# Patient Record
Sex: Female | Born: 1990
Health system: Southern US, Community
[De-identification: ages and names within clinical notes are randomized; demographics above are authoritative.]

## PROBLEM LIST (undated history)

## (undated) DIAGNOSIS — F32A Depression, unspecified: Secondary | ICD-10-CM

## (undated) DIAGNOSIS — F419 Anxiety disorder, unspecified: Secondary | ICD-10-CM

## (undated) HISTORY — PX: TYMPANOSTOMY TUBE PLACEMENT: SHX32

---
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
## 2009-05-18 ENCOUNTER — Emergency Department: Payer: Self-pay | Admitting: Emergency Medicine

## 2011-04-05 ENCOUNTER — Emergency Department: Payer: Self-pay

## 2011-04-08 ENCOUNTER — Emergency Department: Payer: Self-pay | Admitting: Emergency Medicine

## 2011-11-27 ENCOUNTER — Emergency Department: Payer: Self-pay | Admitting: Emergency Medicine

## 2011-11-27 LAB — WET PREP, GENITAL

## 2011-12-08 ENCOUNTER — Emergency Department: Payer: Self-pay | Admitting: Emergency Medicine

## 2011-12-08 LAB — BASIC METABOLIC PANEL
Anion Gap: 8 (ref 7–16)
Calcium, Total: 8.8 mg/dL (ref 8.5–10.1)
Chloride: 106 mmol/L (ref 98–107)
Co2: 27 mmol/L (ref 21–32)
Glucose: 81 mg/dL (ref 65–99)
Osmolality: 279 (ref 275–301)
Sodium: 141 mmol/L (ref 136–145)

## 2011-12-08 LAB — CBC
HCT: 41.3 % (ref 35.0–47.0)
HGB: 14.1 g/dL (ref 12.0–16.0)
MCH: 30.3 pg (ref 26.0–34.0)
RBC: 4.67 10*6/uL (ref 3.80–5.20)
WBC: 7.9 10*3/uL (ref 3.6–11.0)

## 2011-12-25 ENCOUNTER — Emergency Department: Payer: Self-pay | Admitting: Emergency Medicine

## 2012-05-05 ENCOUNTER — Emergency Department: Payer: Self-pay | Admitting: Internal Medicine

## 2012-05-05 LAB — URINALYSIS, COMPLETE
Bilirubin,UR: NEGATIVE
Blood: NEGATIVE
Glucose,UR: NEGATIVE mg/dL (ref 0–75)
Ketone: NEGATIVE
Nitrite: NEGATIVE
Ph: 7 (ref 4.5–8.0)
RBC,UR: NONE SEEN /HPF (ref 0–5)
Specific Gravity: 1.011 (ref 1.003–1.030)
Squamous Epithelial: 30
WBC UR: 2 /HPF (ref 0–5)

## 2012-05-05 LAB — WET PREP, GENITAL

## 2012-07-17 ENCOUNTER — Observation Stay: Payer: Self-pay | Admitting: Internal Medicine

## 2012-09-14 ENCOUNTER — Emergency Department: Payer: Self-pay | Admitting: Emergency Medicine

## 2012-10-07 ENCOUNTER — Inpatient Hospital Stay: Payer: Self-pay | Admitting: Obstetrics & Gynecology

## 2012-10-07 LAB — CBC WITH DIFFERENTIAL/PLATELET
Basophil #: 0.1 10*3/uL (ref 0.0–0.1)
Eosinophil #: 0.1 10*3/uL (ref 0.0–0.7)
Lymphocyte #: 2.4 10*3/uL (ref 1.0–3.6)
Lymphocyte %: 22.8 %
MCH: 31.3 pg (ref 26.0–34.0)
Monocyte #: 1.1 x10 3/mm — ABNORMAL HIGH (ref 0.2–0.9)
Platelet: 119 10*3/uL — ABNORMAL LOW (ref 150–440)
RBC: 4.12 10*6/uL (ref 3.80–5.20)
RDW: 13.4 % (ref 11.5–14.5)

## 2013-06-15 IMAGING — US US EXTREM UP VENOUS*R*
1 series · 17 of 24 positions shown · non-contrast
Comparison: none

REASON FOR EXAM: discomfort, mild swelling, to right arm, eval for DVT
COMMENTS:

[Series 1: us extrem up venous*right* · 17 of 52 slices shown]
[im 1/52]
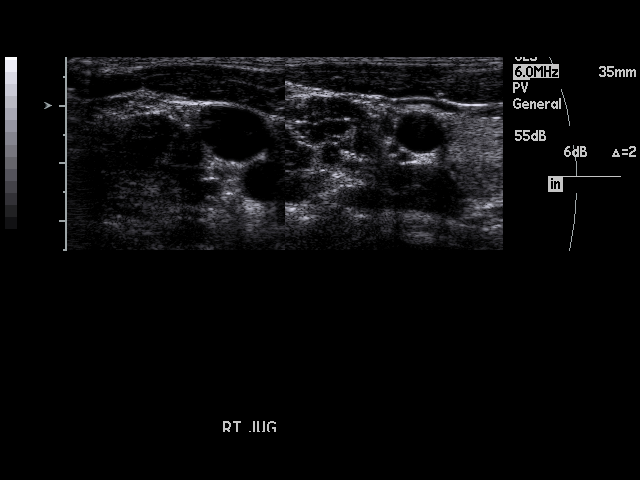
[im 5/52]
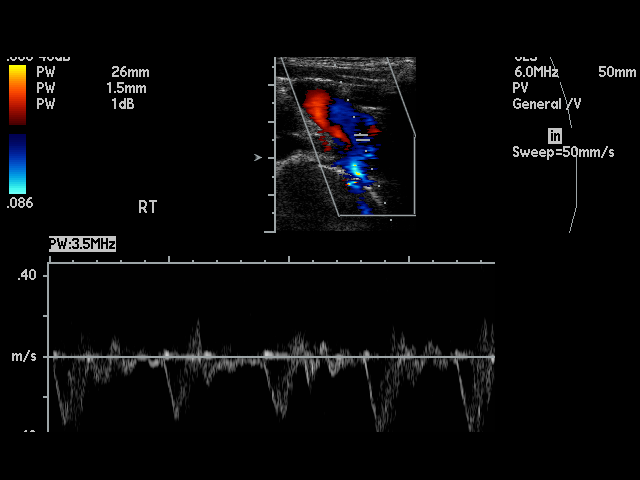
[im 7/52]
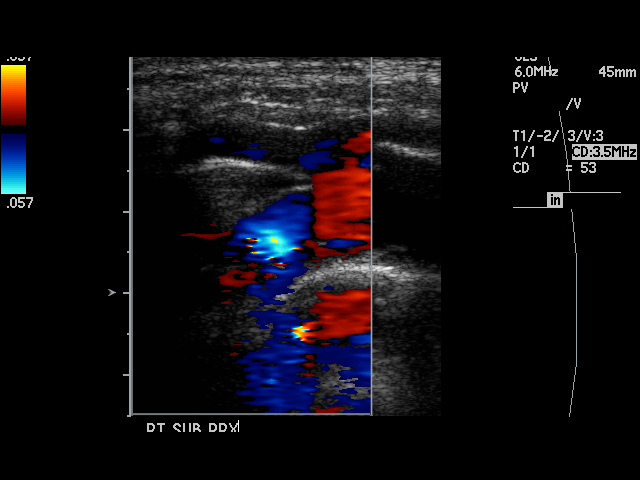
[im 9/52]
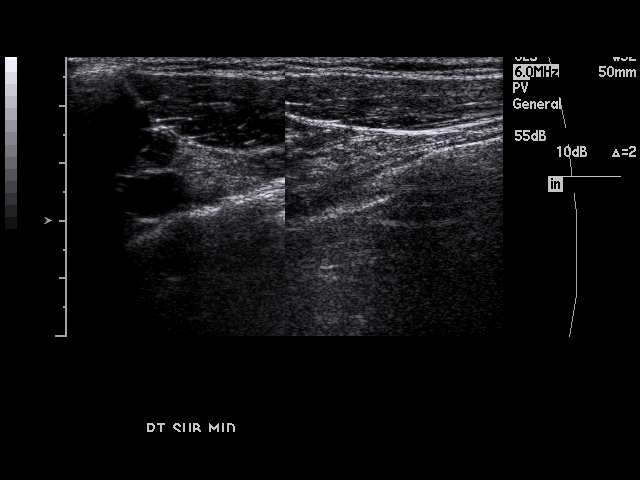
[im 14/52]
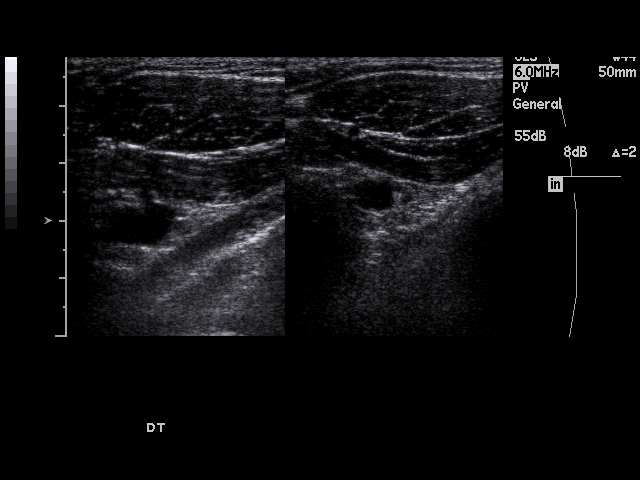
[im 16/52]
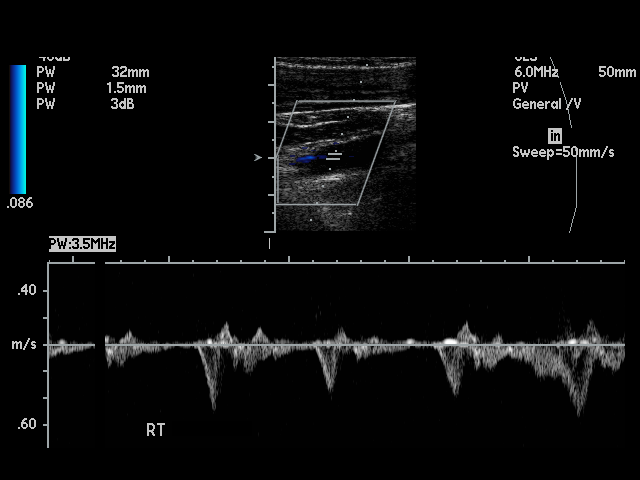
[im 20/52]
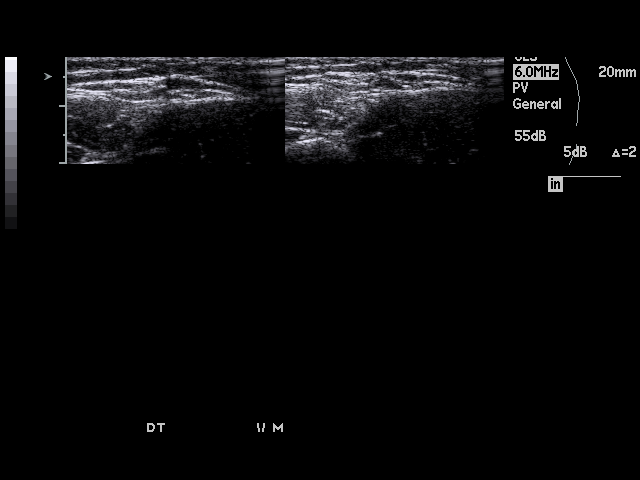
[im 23/52]
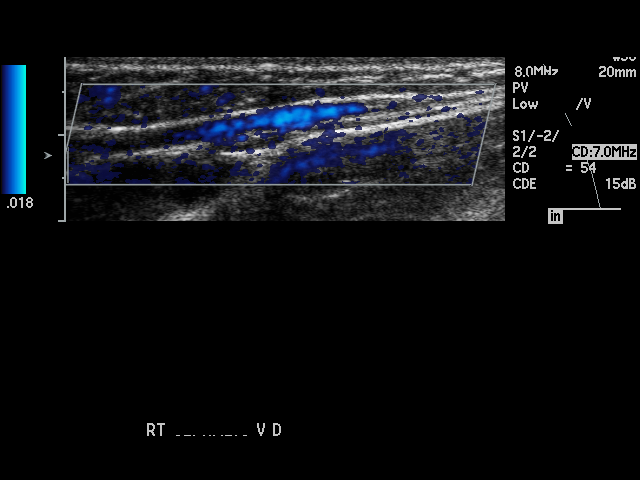
[im 27/52]
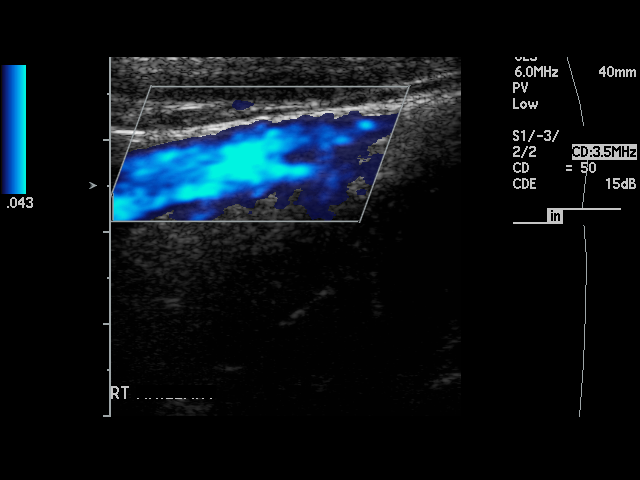
[im 29/52]
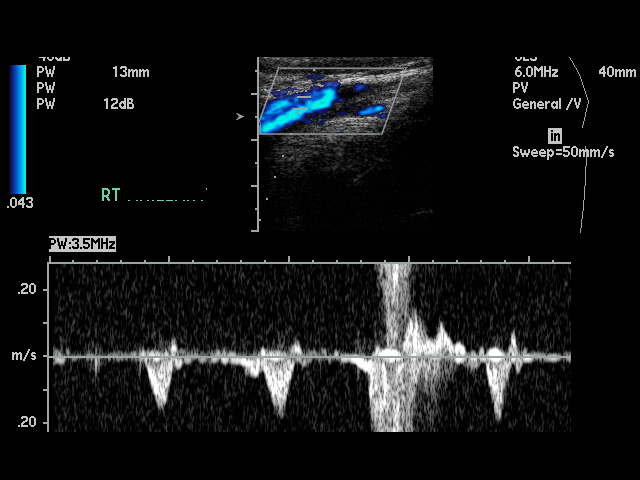
[im 32/52]
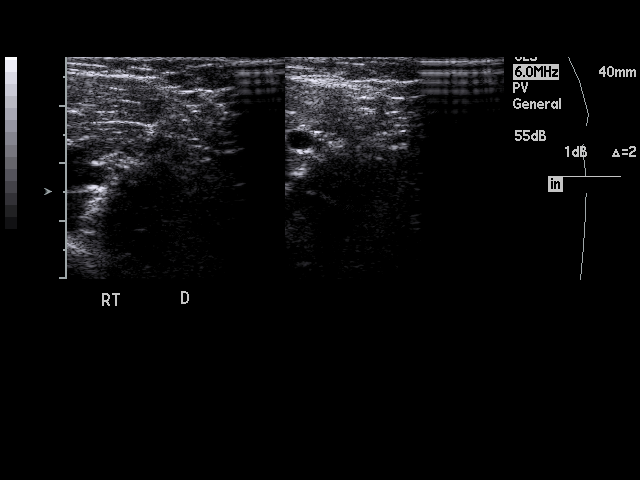
[im 36/52]
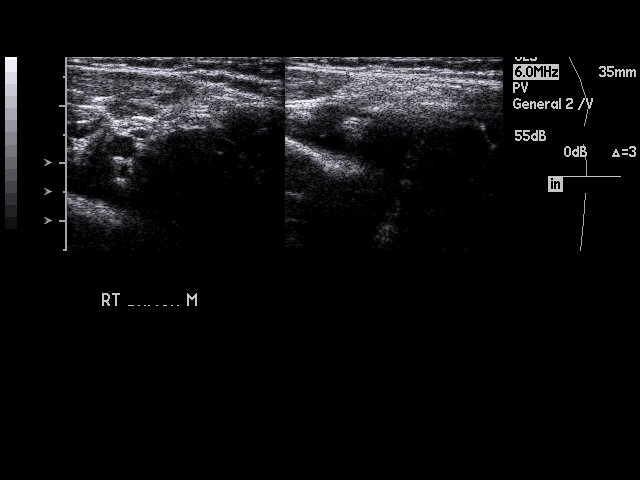
[im 38/52]
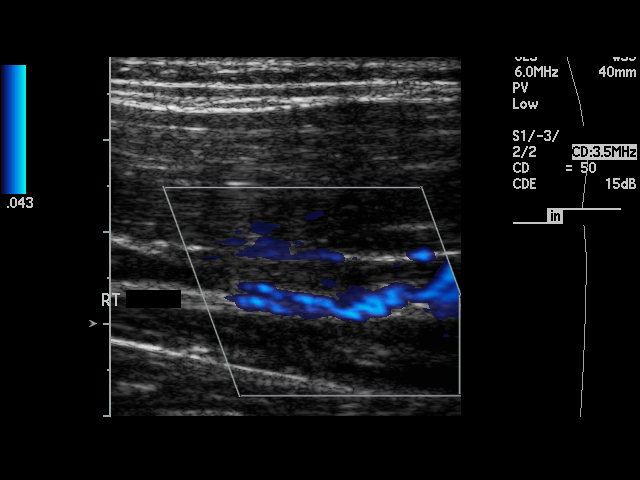
[im 43/52]
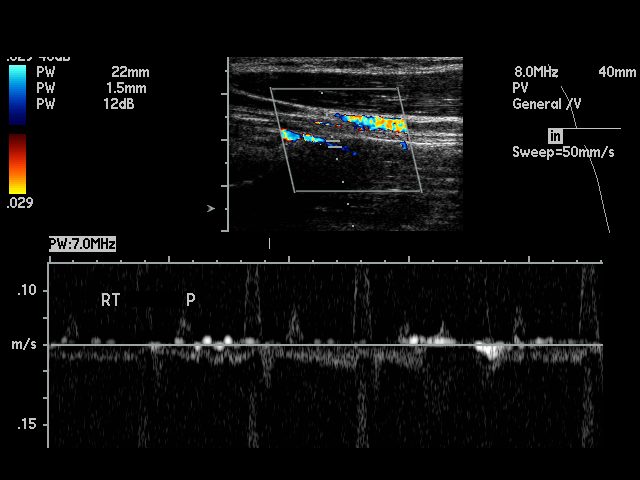
[im 45/52]
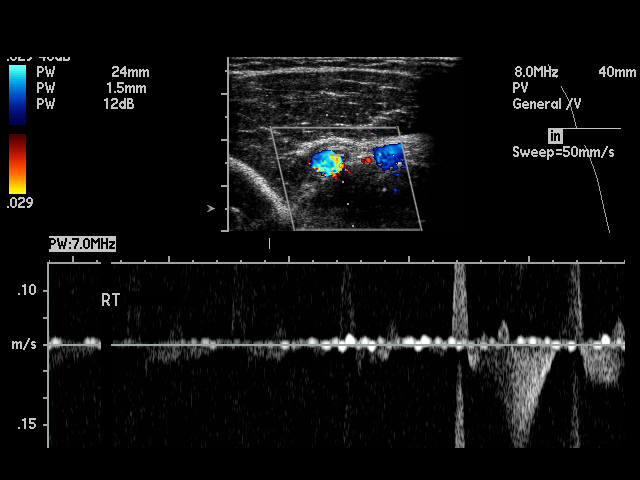
[im 47/52]
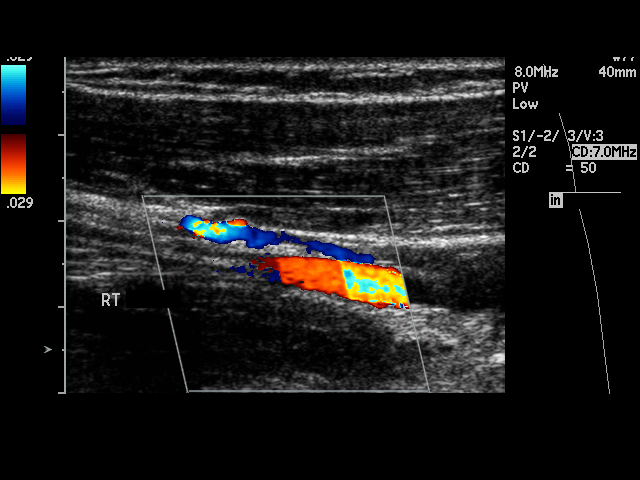
[im 52/52]
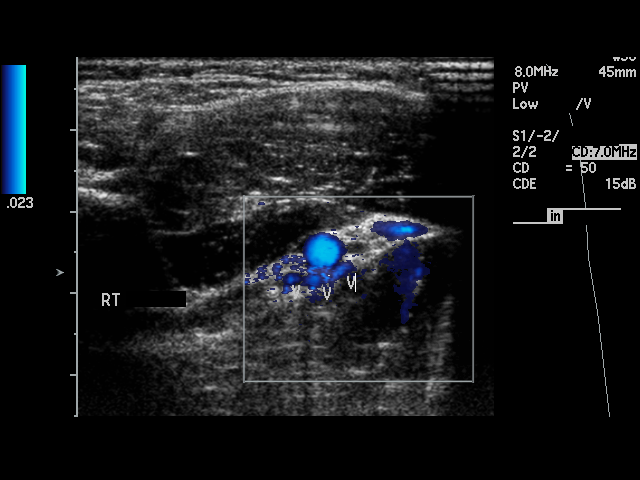

[17 of 24 positions shown; findings below may reference images not displayed]

PROCEDURE:     US  - US DOPPLER UP EXTR RIGHT  - December 08, 2011 [DATE]

RESULT:     The venous system of the left upper arm is interrogated from the
level of the jugular vein inferiorly to the elbow. The subclavian, axillary
and brachial veins are patent with no thrombus or occlusion identified. The
superficial venous system of the right upper arm including the cephalic and
basilic vein is also noted to be patent with no thrombus or occlusion
identified. The antecubital vein is not seen due to the presence of a
bandage.
IMPRESSION: 1. No venous thrombosis is identified in the right upper arm.
2. Note is made that the antecubital vein is obscured by a bandage.

## 2014-02-23 ENCOUNTER — Ambulatory Visit: Payer: Self-pay | Admitting: Physician Assistant

## 2015-01-15 NOTE — Op Note (Signed)
PATIENT NAMEHARA, [name] MR#:  [id number] DATE OF BIRTH:  [birth year]  DATE OF PROCEDURE:  10/07/2012  PREOPERATIVE DIAGNOSES: 1. Intrauterine pregnancy at 37 weeks, 3 days.  2. Breech presentation.  3. Labor. 4. Uterine didelphys.   POSTOPERATIVE DIAGNOSES:  1. Intrauterine pregnancy at 37 weeks, 3 days.  2. Breech presentation.  3. Labor. 4. Uterine didelphys.   PROCEDURE PERFORMED: Primary low transverse cesarean section.   SURGEON: Verlin Grills, M.D.   ASSISTANT: Marta Antu, CNM   ANESTHESIA: Spinal.   INDICATION: A 24 year old G1 who is 37 weeks, 3 days who presented with ruptured membranes and contractions with baby in complete breech presentation.   COMPLICATIONS: None.   ESTIMATED BLOOD LOSS: 600 mL.   IV FLUIDS: 1500 mL crystalloid.   URINE OUTPUT: 150 mL, clear.   FINDINGS: Normal tubes and ovaries bilaterally. Uterine didelphys as previously noted. Baby in complete breech presentation with only terminal meconium. Apgars 8 and 9. Weight 2780 grams. Pediatricians were present for delivery.   MEDICATIONS: One gram Ancef preoperatively and 20 units dilute Pitocin following placental delivery.   DISPOSITION: Mother to recovery room and infant to newborn nursery, both in stable condition.   PROCEDURE IN DETAIL: The patient was taken to the operating room where she was given anesthesia via spinal. She was laid supine on the operating room table and prepped and draped in standard sterile fashion. Prior to incision level of the anesthesia was checked and found to be adequate. 2 cm above the pubic symphysis, a Pfannenstiel incision was made in the skin using the knife and carried down sharply to the level of the rectus fascia. The rectus fascia was then incised in the midline. The fascial opening was extended bilaterally superiorly with Mayo scissors. The superior border of the rectus fascia was then grasped, elevated and bluntly dissected off the underlying  rectus muscles. The median raphe was taken down with Mayo scissors and blunt dissection. Attention was then turned to the inferior border of the rectus fascia and it was taken down in a similar manner, to the level of the pubic symphysis. The rectus muscles were then separated in the midline. The peritoneum was identified, elevated using hemostats and incised with Metzenbaum scissors. Peritoneal opening was extended manually by pulling. The bladder blade was then inserted and the bladder was noted to be in a low position away from site of desired hysterotomy. The operator's hand was inserted into the uterus to find the uterus to be in nonrotated position with baby in the left aspect of uterus. Hysterotomy incision was then made using the knife, low transverse. This was carried down and the uterine cavity was entered bluntly with the operator's finger. Hysterotomy was extended bilaterally and superiorly using the operator's fingers. The operator's hand was inserted into the uterus to find the infant in the breech presentation. The infant's feet were grasped and brought to the level of the hysterotomy and then with gentle traction the baby was delivered up to the level of the shoulder blades at which time the baby was rotated 45 degrees. The right arm was delivered, rotated 180 degrees, the left arm was delivered, and then the baby's head was delivered using Mauriceau-Smellie-Viet maneuver and fundal pressure. Cord was doubly clamped and cut. The infant was passed to waiting pediatricians. Placenta was delivered using fundal pressure. The uterus was exteriorized. The bladder blade was reinserted. The uterine cavity was wiped free of all clots and debris. Both left and right uterine cavities were  inspected as the right uterus was also inadvertently entered. The hysterotomy was repaired first with 0 Vicryl suture in a running locked fashion followed by a second 0 Vicryl stitch in a horizontal imbricating fashion.  Hysterotomy was inspected and found to be hemostatic. The uterus was then replaced into the abdomen. The abdominal gutters were suctioned free of clots and debris. The On-Q catheters were placed infraumbilically just below the level of the rectus fascia. Hysterotomy was again reinspected and found to be hemostatic. The rectus fascia was then closed using a #1 Vicryl stitch in a running fashion. The incision was then copiously irrigated. Hemostasis was achieved using the Bovie. Skin was closed using Insorb absorbable staples and bandaged with Steri-Strips. Dermabond was placed over On-Q catheter sites. All counts were correct x 2. The patient tolerated the procedure well and was taken to the recovery room in stable condition.  ____________________________ Ali LoweEryn K. Garnette GunnerStansbury Clipp, MD eks:sb D: 10/07/2012 03:17:46 ET T: 10/07/2012 10:05:31 ET JOB#: [id number]  cc: Weston SettleEryn K. Garnette GunnerStansbury Clipp, MD, <Dictator> Marlaine HindERYN K STANSBURY CLIPP MD ELECTRONICALLY SIGNED 10/08/2012 14:02
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
## 2015-02-02 NOTE — H&P (Signed)
L&D Evaluation:  History Expanded:   HPI 24 yo G1 with EDD of 10/25/12,  presents at 37 3/7 weeks with SROM at 0015 today. [facility] uterus with 2 cervices and breech presentation, plan for LTCS at 39 weeks. RH negative, rhogam received. H/o HSV - tx with acyclovir ppx.    Blood Type (Maternal) O negative    Group B Strep Results Maternal (Result >5wks must be treated as unknown) negative    Maternal HIV Negative    Maternal Syphilis Ab Nonreactive    Maternal Varicella Immune    Rubella Results (Maternal) immune    Maternal T-Dap Unknown    Patient's Medical History No Chronic Illness    Patient's Surgical History none    Medications Pre Natal Vitamins  zantac    Allergies NKDA    Social History tobacco   ROS:   ROS see HPI   Exam:   Vital Signs stable    General no apparent distress    Mental Status clear    Chest clear    Heart no murmur/gallop/rubs    Abdomen gravid, tender with contractions    Estimated Fetal Weight Average for gestational age    Fetal Position breech per bedside US    Edema no edema    Reflexes 2+    Pelvic both cervices 1 cm    Mebranes Ruptured    Description clear    Ucx regular, q 5 min   Impression:   Impression SROM, labor   Plan:   Comments Dr Elta Guadeloupelipp notified, will plan for urgent C-section.  Terbutaline SQ, IV fluid bolus.   Electronic Signatures: Vella KohlerBrothers, Shanya Ferriss K (CNM)  (Signed 13-Jan-14 01:47)  Authored: L&D Evaluation   Last Updated: 13-Jan-14 01:47 by Vella KohlerBrothers, Casilda Pickerill K (CNM)

## 2015-12-02 ENCOUNTER — Ambulatory Visit
Admission: EM | Admit: 2015-12-02 | Discharge: 2015-12-02 | Discharge status: Absent without leave | Payer: Managed Care, Other (non HMO)

## 2015-12-04 ENCOUNTER — Ambulatory Visit
Admission: EM | Admit: 2015-12-04 | Discharge: 2015-12-04 | Disposition: A | Discharge status: Home / Self Care | Payer: Managed Care, Other (non HMO) | Attending: Family Medicine | Admitting: Family Medicine

## 2015-12-04 ENCOUNTER — Encounter: Payer: Self-pay | Admitting: Gynecology

## 2015-12-04 DIAGNOSIS — J069 Acute upper respiratory infection, unspecified: Secondary | ICD-10-CM | POA: Diagnosis not present

## 2015-12-04 DIAGNOSIS — H66012 Acute suppurative otitis media with spontaneous rupture of ear drum, left ear: Secondary | ICD-10-CM | POA: Diagnosis not present

## 2015-12-04 MED ORDER — FEXOFENADINE-PSEUDOEPHED ER 180-240 MG PO TB24: 1.0000 | ORAL_TABLET | Freq: Every day | ORAL | Status: DC

## 2015-12-04 MED ORDER — AMOXICILLIN-POT CLAVULANATE 875-125 MG PO TABS: 1.0000 | ORAL_TABLET | Freq: Two times a day (BID) | ORAL | Status: DC

## 2015-12-04 NOTE — Discharge Instructions (Signed)
Otitis Media, Adult °Otitis media is redness, soreness, and puffiness (swelling) in the space just behind your eardrum (middle ear). It may be caused by allergies or infection. It often happens along with a cold. °HOME CARE °· Take your medicine as told. Finish it even if you start to feel better. °· Only take over-the-counter or prescription medicines for pain, discomfort, or fever as told by your doctor. °· Follow up with your doctor as told. °GET HELP IF: °· You have otitis media only in one ear, or bleeding from your nose, or both. °· You notice a lump on your neck. °· You are not getting better in 3-5 days. °· You feel worse instead of better. °GET HELP RIGHT AWAY IF:  °· You have pain that is not helped with medicine. °· You have puffiness, redness, or pain around your ear. °· You get a stiff neck. °· You cannot move part of your face (paralysis). °· You notice that the bone behind your ear hurts when you touch it. °MAKE SURE YOU:  °· Understand these instructions. °· Will watch your condition. °· Will get help right away if you are not doing well or get worse. °  °This information is not intended to replace advice given to you by your health care provider. Make sure you discuss any questions you have with your health care provider. °  °Document Released: 02/28/2008 Document Revised: 10/02/2014 Document Reviewed: 04/08/2013 °Elsevier Interactive Patient Education ©2016 [employer] ° ° °

## 2015-12-04 NOTE — ED Provider Notes (Signed)
CSN: [id number]     Arrival date & time 12/04/15  4098 History   First MD Initiated Contact with Patient 12/04/15 1025   Nurses notes were reviewed. Chief Complaint  Patient presents with  . Otalgia   Mother is not best historian but child's crying in the room as well. Apparently she and the child both been sick now for about 2 weeks off and on. One would seem to be recovering from the URI and the other one would get sick. She states about 2-3 days ago her left ear started bothering her. Got worse last night. She has a history of recurrent ear infections as a child and has had ear tubes and other ear problems as well previously. She does report some drainage coming from the left ear. She does not report problems with the right ear at this time. She states that she's doesn't smoke and other than her daughter being sick no pertinent family medical history is is available. She denies any fever.   (Consider location/radiation/quality/duration/timing/severity/associated sxs/prior Treatment) Patient is a 25 y.o. female presenting with ear pain. The history is provided by the patient. No language interpreter was used.  Otalgia Location:  Left Quality:  Pressure Severity:  Moderate Progression:  Worsening Chronicity:  New Associated symptoms: congestion, cough, ear discharge and rhinorrhea   Associated symptoms: no rash   Risk factors: chronic ear infection and prior ear surgery     History reviewed. No pertinent past medical history. Past Surgical History  Procedure Laterality Date  . Tympanostomy tube placement     No family history on file. Social History  Substance Use Topics  . Smoking status: Never Smoker   . Smokeless tobacco: None  . Alcohol Use: Yes   OB History    No data available     Review of Systems  HENT: Positive for congestion, ear discharge, ear pain and rhinorrhea.   Respiratory: Positive for cough.   Skin: Negative for rash.  All other systems reviewed and are  negative.   Allergies  Review of patient's allergies indicates no known allergies.  Home Medications   Prior to Admission medications   Medication Sig Start Date End Date Taking? Authorizing Provider  amoxicillin-clavulanate (AUGMENTIN) 875-125 MG tablet Take 1 tablet by mouth 2 (two) times daily. 12/04/15   Hassan Rowan, MD  fexofenadine-pseudoephedrine (ALLEGRA-D ALLERGY & CONGESTION) 180-240 MG 24 hr tablet Take 1 tablet by mouth daily. 12/04/15   Hassan Rowan, MD   Meds Ordered and Administered this Visit  Medications - No data to display  BP 119/87 mmHg  Pulse 100  Temp(Src) 98.1 F (36.7 C) (Oral)  Ht 5' (1.524 m)  Wt 163 lb (73.936 kg)  BMI 31.83 kg/m2  SpO2 98%  LMP 11/13/2015 No data found.   Physical Exam  Constitutional: She is oriented to person, place, and time. She appears well-developed.  HENT:  Head: Normocephalic and atraumatic.  Right Ear: Tympanic membrane is erythematous and bulging.  Left Ear: There is drainage.  Nose: Mucosal edema and rhinorrhea present.  Mouth/Throat: Normal dentition. No dental caries. No posterior oropharyngeal erythema.  Eyes: Conjunctivae are normal. Pupils are equal, round, and reactive to light.  Neck: Normal range of motion. Neck supple.  Pulmonary/Chest: Breath sounds normal.  Musculoskeletal: Normal range of motion.  Neurological: She is alert and oriented to person, place, and time.  Skin: Skin is warm and dry.  Psychiatric: She has a normal mood and affect.  Vitals reviewed.   ED Course  Procedures (including critical care time)  Labs Review Labs Reviewed - No data to display  Imaging Review No results found.   Visual Acuity Review  Right Eye Distance:   Left Eye Distance:   Bilateral Distance:    Right Eye Near:   Left Eye Near:    Bilateral Near:         MDM   1. Acute suppurative otitis media of left ear with spontaneous rupture of tympanic membrane, recurrence not specified   2. URI, acute     We'll place mom on Augmentin 875 one tablet twice a day Allegra-D 24 hours 1 tablet daily follow-up with PCP in 2 weeks to make sure that the eardrum has healed and will require ENT referral.  Note: This dictation was prepared with Dragon dictation along with smaller phrase technology. Any transcriptional errors that result from this process are unintentional.    Hassan RowanEugene Danyetta Gillham, MD 12/04/15 1105

## 2015-12-04 NOTE — ED Notes (Signed)
Patient stated left ear ache x 4 days.

## 2020-08-16 ENCOUNTER — Ambulatory Visit
Admission: EM | Admit: 2020-08-16 | Discharge: 2020-08-16 | Disposition: A | Discharge status: Home / Self Care | Payer: 59

## 2020-08-16 ENCOUNTER — Other Ambulatory Visit: Payer: Self-pay

## 2020-08-16 NOTE — ED Triage Notes (Signed)
Patient states she had an unwitnessed fall this morning and states she had LOC for about 10 min. She is c/o facial pain and neck pain. Her mother drove her here to urgent care. Patient asked if this UC had CT capability and this RN advised that we do not have CT today.

## 2021-08-06 ENCOUNTER — Ambulatory Visit
Admission: EM | Admit: 2021-08-06 | Discharge: 2021-08-06 | Disposition: A | Discharge status: Home / Self Care | Payer: BLUE CROSS/BLUE SHIELD

## 2021-08-06 ENCOUNTER — Other Ambulatory Visit: Payer: Self-pay

## 2021-08-06 ENCOUNTER — Ambulatory Visit (INDEPENDENT_AMBULATORY_CARE_PROVIDER_SITE_OTHER): Payer: BLUE CROSS/BLUE SHIELD

## 2021-08-06 DIAGNOSIS — R0602 Shortness of breath: Secondary | ICD-10-CM

## 2021-08-06 DIAGNOSIS — R059 Cough, unspecified: Secondary | ICD-10-CM | POA: Diagnosis not present

## 2021-08-06 DIAGNOSIS — R0981 Nasal congestion: Secondary | ICD-10-CM | POA: Diagnosis not present

## 2021-08-06 DIAGNOSIS — J189 Pneumonia, unspecified organism: Secondary | ICD-10-CM | POA: Diagnosis not present

## 2021-08-06 DIAGNOSIS — R051 Acute cough: Secondary | ICD-10-CM | POA: Diagnosis not present

## 2021-08-06 HISTORY — DX: Anxiety disorder, unspecified: F41.9

## 2021-08-06 HISTORY — DX: Depression, unspecified: F32.A

## 2021-08-06 MED ORDER — AMOXICILLIN-POT CLAVULANATE 875-125 MG PO TABS: 1.0000 | ORAL_TABLET | Freq: Two times a day (BID) | ORAL | 0 refills | Status: AC

## 2021-08-06 MED ORDER — AZITHROMYCIN 250 MG PO TABS: 250.0000 mg | ORAL_TABLET | Freq: Every day | ORAL | 0 refills | Status: DC

## 2021-08-06 MED ORDER — ALBUTEROL SULFATE HFA 108 (90 BASE) MCG/ACT IN AERS: 1.0000 | INHALATION_SPRAY | Freq: Four times a day (QID) | RESPIRATORY_TRACT | 0 refills | Status: DC | PRN

## 2021-08-06 MED ORDER — ALBUTEROL SULFATE HFA 108 (90 BASE) MCG/ACT IN AERS
1.0000 | INHALATION_SPRAY | Freq: Four times a day (QID) | RESPIRATORY_TRACT | 0 refills | Status: AC | PRN
Start: 2021-08-06 — End: ?

## 2021-08-06 MED ORDER — AMOXICILLIN-POT CLAVULANATE 875-125 MG PO TABS: 1.0000 | ORAL_TABLET | Freq: Two times a day (BID) | ORAL | 0 refills | Status: DC

## 2021-08-06 NOTE — Discharge Instructions (Signed)
-   It does look like you have pneumonia of the left lower lobe.  I have sent 2 different antibiotics to cover you for infection. - Can take Robitussin OTC, but it can have affects on milk supply.  Be advised that cough can linger for a little while even after you sure you feel better. - If you have any fever or increased shortness of breath you should be seen again.  Make sure to increase rest and fluids. - Follow-up with your PCP in the next week or 2 for reexamination.  In about a month you will need a repeat x-ray to make sure the infection is cleared. - Go to emergency department for any severe acute worsening of her symptoms.

## 2021-08-06 NOTE — ED Triage Notes (Signed)
Pt states she had flu 2 weeks ago. Cough started about a week ago and "settled in my chest". Coughing up whitish phlegm and hurts to take a deep breath.

## 2021-08-06 NOTE — ED Provider Notes (Signed)
[facility]    CSN: [id number] Arrival date & time: 08/06/21  0813      History   Chief Complaint Chief Complaint  Patient presents with   Cough    HPI [name] is a 30 y.o. female presenting for 2-week history of cough.  Patient diagnosed with influenza about 2 weeks ago.  States she feels like the infection has settled in her chest.  Cough is productive of whitish phlegm.  Admits to pain on taking a deep breath.  Patient says she felt better after few days of the fluid but then over the past week her symptoms have gotten a lot worse.  She says the cough is worse and deeper.  She also reports occasional chest tightness and breathing difficulty.  Patient says she has felt feverish but has not checked her temperature.  Current temperature is 99.1 degrees.  Patient has not been taking any cough medication over-the-counter.  Denies any sick contacts or known exposure to COVID-19.  Medical history significant for anxiety and depression but does not report any history of cardiopulmonary disease.  Patient is currently breast-feeding.  No other complaints.  HPI  Past Medical History:  Diagnosis Date   Anxiety    Depression     There are no problems to display for this patient.   Past Surgical History:  Procedure Laterality Date   TYMPANOSTOMY TUBE PLACEMENT      OB History   No obstetric history on file.      Home Medications    Prior to Admission medications   Medication Sig Start Date End Date Taking? Authorizing Provider  busPIRone (BUSPAR) 5 MG tablet Take by mouth. 08/18/20 08/18/21 Yes [provider]  citalopram (CELEXA) 20 MG tablet Take by mouth. 07/18/16  Yes [provider]  albuterol (VENTOLIN HFA) 108 (90 Base) MCG/ACT inhaler Inhale 1-2 puffs into the lungs every 6 (six) hours as needed for wheezing or shortness of breath. 08/06/21   Eusebio Friendly B, PA-C  amoxicillin-clavulanate (AUGMENTIN) 875-125 MG tablet Take 1 tablet by  mouth every 12 (twelve) hours for 7 days. 08/06/21 08/13/21  Eusebio Friendly B, PA-C  azithromycin (ZITHROMAX) 250 MG tablet Take 1 tablet (250 mg total) by mouth daily. Take first 2 tablets together, then 1 every day until finished. 08/06/21   Shirlee Latch, PA-C    Family History History reviewed. No pertinent family history.  Social History Social History   Tobacco Use   Smoking status: Never   Smokeless tobacco: Never  Vaping Use   Vaping Use: Never used  Substance Use Topics   Alcohol use: Yes   Drug use: No     Allergies   Patient has no known allergies.   Review of Systems Review of Systems  Constitutional:  Positive for fatigue. Negative for chills, diaphoresis and fever.  HENT:  Positive for congestion and rhinorrhea. Negative for ear pain, sinus pressure, sinus pain and sore throat.   Respiratory:  Positive for cough, chest tightness and shortness of breath. Negative for wheezing.   Cardiovascular:  Positive for chest pain (rib pain on breathing).  Gastrointestinal:  Negative for abdominal pain, nausea and vomiting.  Musculoskeletal:  Negative for arthralgias and myalgias.  Skin:  Negative for rash.  Neurological:  Negative for weakness and headaches.  Hematological:  Negative for adenopathy.    Physical Exam Triage Vital Signs ED Triage Vitals  Enc Vitals Group     BP      Pulse  Resp      Temp      Temp src      SpO2      Weight      Height      Head Circumference      Peak Flow      Pain Score      Pain Loc      Pain Edu?      Excl. in GC?    No data found.  Updated Vital Signs BP 96/63 (BP Location: Right Arm)   Pulse (!) 105   Temp 99.1 F (37.3 C) (Oral)   Resp 20   Ht 5\' 1"  (1.549 m)   Wt 195 lb (88.5 kg)   LMP 11/11/2020   SpO2 96%   BMI 36.84 kg/m      Physical Exam Vitals and nursing note reviewed.  Constitutional:      General: She is not in acute distress.    Appearance: Normal appearance. She is ill-appearing. She  is not toxic-appearing.  HENT:     Head: Normocephalic and atraumatic.     Nose: Congestion present.     Mouth/Throat:     Mouth: Mucous membranes are moist.     Pharynx: Oropharynx is clear. No posterior oropharyngeal erythema.  Eyes:     General: No scleral icterus.       Right eye: No discharge.        Left eye: No discharge.     Conjunctiva/sclera: Conjunctivae normal.  Cardiovascular:     Rate and Rhythm: Normal rate and regular rhythm.     Heart sounds: Normal heart sounds.  Pulmonary:     Effort: Pulmonary effort is normal. No respiratory distress.     Breath sounds: Rhonchi (LLL) and rales (LLL) present.  Musculoskeletal:     Cervical back: Neck supple.  Skin:    General: Skin is dry.  Neurological:     General: No focal deficit present.     Mental Status: She is alert. Mental status is at baseline.     Motor: No weakness.     Gait: Gait normal.  Psychiatric:        Mood and Affect: Mood normal.        Behavior: Behavior normal.        Thought Content: Thought content normal.     UC Treatments / Results  Labs (all labs ordered are listed, but only abnormal results are displayed) Labs Reviewed - No data to display  EKG   Radiology DG Chest 2 View  Result Date: 08/06/2021 CLINICAL DATA:  Cough for 2 weeks.  Recent flu EXAM: CHEST - 2 VIEW COMPARISON:  None. FINDINGS: Subtle reticulonodular opacity at the left base. No edema, effusion, or pneumothorax. Normal heart size. IMPRESSION: Early pneumonia on the left. Electronically Signed   By: 13/08/2021 M.D.   On: 08/06/2021 09:26    Procedures Procedures (including critical care time)  Medications Ordered in UC Medications - No data to display  Initial Impression / Assessment and Plan / UC Course  I have reviewed the triage vital signs and the nursing notes.  Pertinent labs & imaging results that were available during my care of the patient were reviewed by me and considered in my medical decision  making (see chart for details).  30 year old female presenting for 2-week history of cough and congestion.  Worsening symptoms over the past 1 week with associated chest tightness and shortness of breath.  Vital stable today.  Patient  is ill-appearing but nontoxic.  Exam does reveal nasal congestion.  She also has rhonchi and rales of the left lower lobe.  Chest x-ray ordered out of concern for community-acquired pneumonia.  Chest x-ray independently viewed by me.  X-ray result does show early pneumonia of the left lower lobe.  Discussed this with patient.  Will treat for community-acquired pneumonia with Augmentin and azithromycin.  Also sent in ProAir.  Advised increasing rest and fluids.  Advised she can take over-the-counter Robitussin but this could have effects on her milk supply.  Reviewed return and ED precautions as well as when to follow-up with PCP.   Final Clinical Impressions(s) / UC Diagnoses   Final diagnoses:  Community acquired pneumonia of left lower lobe of lung  Acute cough  Nasal congestion  Shortness of breath     Discharge Instructions      - It does look like you have pneumonia of the left lower lobe.  I have sent 2 different antibiotics to cover you for infection. - Can take Robitussin OTC, but it can have affects on milk supply.  Be advised that cough can linger for a little while even after you sure you feel better. - If you have any fever or increased shortness of breath you should be seen again.  Make sure to increase rest and fluids. - Follow-up with your PCP in the next week or 2 for reexamination.  In about a month you will need a repeat x-ray to make sure the infection is cleared. - Go to emergency department for any severe acute worsening of her symptoms.     ED Prescriptions     Medication Sig Dispense Auth. Provider   amoxicillin-clavulanate (AUGMENTIN) 875-125 MG tablet  (Status: Discontinued) Take 1 tablet by mouth every 12 (twelve) hours for 7  days. 14 tablet Eusebio Friendly B, PA-C   albuterol (VENTOLIN HFA) 108 (90 Base) MCG/ACT inhaler  (Status: Discontinued) Inhale 1-2 puffs into the lungs every 6 (six) hours as needed for wheezing or shortness of breath. 1 g Eusebio Friendly B, PA-C   azithromycin (ZITHROMAX) 250 MG tablet  (Status: Discontinued) Take 1 tablet (250 mg total) by mouth daily. Take first 2 tablets together, then 1 every day until finished. 6 tablet Eusebio Friendly B, PA-C   albuterol (VENTOLIN HFA) 108 (90 Base) MCG/ACT inhaler Inhale 1-2 puffs into the lungs every 6 (six) hours as needed for wheezing or shortness of breath. 1 g Eusebio Friendly B, PA-C   amoxicillin-clavulanate (AUGMENTIN) 875-125 MG tablet Take 1 tablet by mouth every 12 (twelve) hours for 7 days. 14 tablet Eusebio Friendly B, PA-C   azithromycin (ZITHROMAX) 250 MG tablet Take 1 tablet (250 mg total) by mouth daily. Take first 2 tablets together, then 1 every day until finished. 6 tablet Gareth Morgan      PDMP not reviewed this encounter.   Shirlee Latch, PA-C 08/06/21 [phone]

## 2022-09-25 NOTE — L&D Delivery Note (Signed)
Delivery Note  Date of delivery: 06/26/2023 Estimated Date of Delivery: 07/20/23 Patient's last menstrual period was 10/13/2022. EGA: [redacted]w[redacted]d  Delivery Note At 6:56 PM a viable female was delivered via VBAC, Spontaneous (Presentation: Right Occiput Anterior).  APGAR: 9, 9; weight pending.  Placenta status: Spontaneous, Intact.  Cord: 3 vessels with the following complications: None.   First Stage: Labor onset: unknown Augmentation : none Analgesia /Anesthesia intrapartum: Epidural SROM at 0600  [name] presented to L&D with SROM and active labor. She was expectantly managed. Epidural placed for pain relief.   Second Stage: Complete dilation at 1828 Onset of pushing at 1836 FHR second stage Cat I Delivery at 1856 on 06/26/2023  She progressed to complete and had a spontaneous vaginal birth of a live female over an intact perineum. The fetal head was delivered in OA position with restitution to ROA. No nuchal cord. Anterior then posterior shoulders delivered spontaneously with minimal assistance. Baby placed on mom's abdomen and attended to by transition RN. Cord clamped and cut after 1+ mins by FOB. Cord blood obtained for newborn labs.  Third Stage: Placenta delivered intact with 3VC at 1903 Placenta disposition: routine disposal  Uterine tone firm / bleeding min IV pitocin given for hemorrhage prophylaxis  Anesthesia: Epidural Episiotomy: None Lacerations: None Suture Repair: n/a Est. Blood Loss (mL): 100  Complications: none  Mom to postpartum.  Baby to Couplet care / Skin to Skin.  Newborn: Birth Weight: pending  Apgar Scores: 9, 9 Feeding planned: breastfeeding   [name], CNM 06/26/2023 7:50 PM

## 2023-01-03 LAB — OB RESULTS CONSOLE VARICELLA ZOSTER ANTIBODY, IGG: Varicella: IMMUNE

## 2023-01-03 LAB — OB RESULTS CONSOLE ABO/RH: RH Type: NEGATIVE

## 2023-01-03 LAB — OB RESULTS CONSOLE RUBELLA ANTIBODY, IGM: Rubella: IMMUNE

## 2023-01-03 LAB — OB RESULTS CONSOLE RPR: RPR: NONREACTIVE

## 2023-01-03 LAB — OB RESULTS CONSOLE HIV ANTIBODY (ROUTINE TESTING): HIV: NONREACTIVE

## 2023-01-03 LAB — OB RESULTS CONSOLE GC/CHLAMYDIA
Chlamydia: NEGATIVE
Neisseria Gonorrhea: NEGATIVE

## 2023-01-03 LAB — OB RESULTS CONSOLE HEPATITIS B SURFACE ANTIGEN: Hepatitis B Surface Ag: NEGATIVE

## 2023-02-12 IMAGING — CR DG CHEST 2V
2 series · 2 of 2 positions shown · non-contrast
Comparison: None.

CLINICAL DATA: Cough for 2 weeks.  Recent flu

EXAM:
CHEST - 2 VIEW

[chest pa]
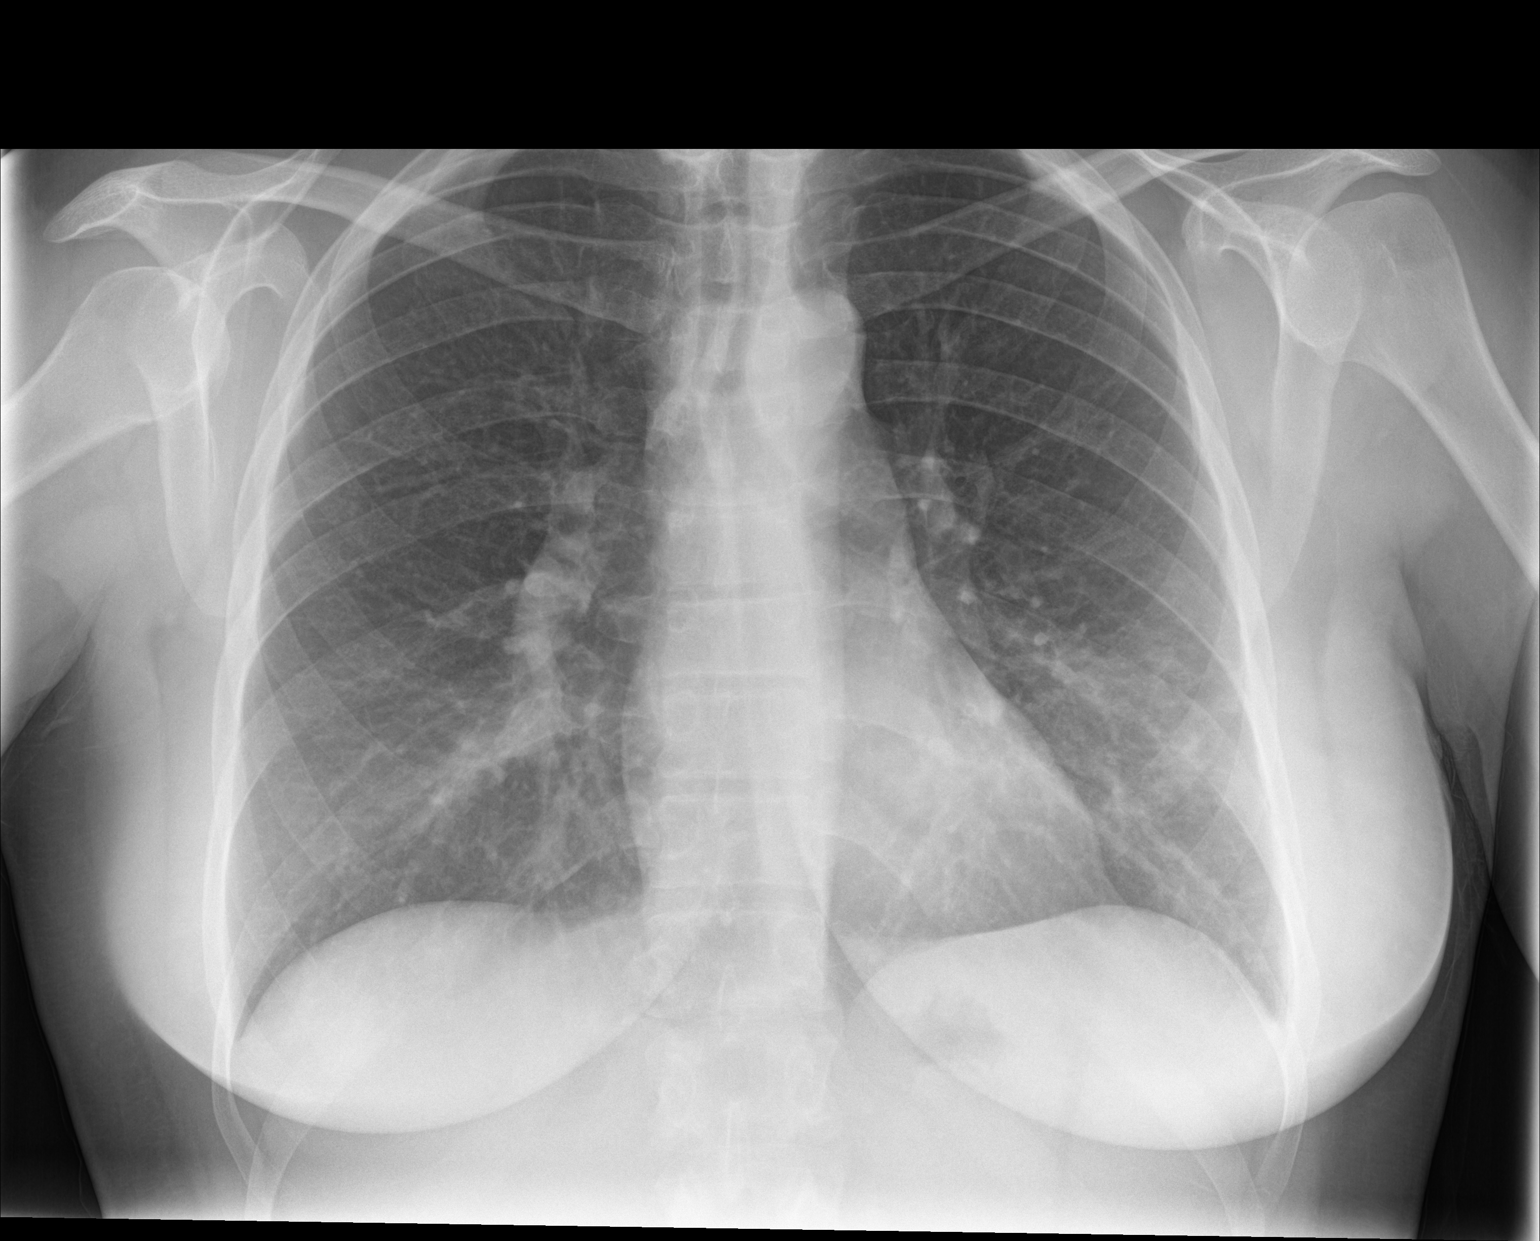

[chest lat]
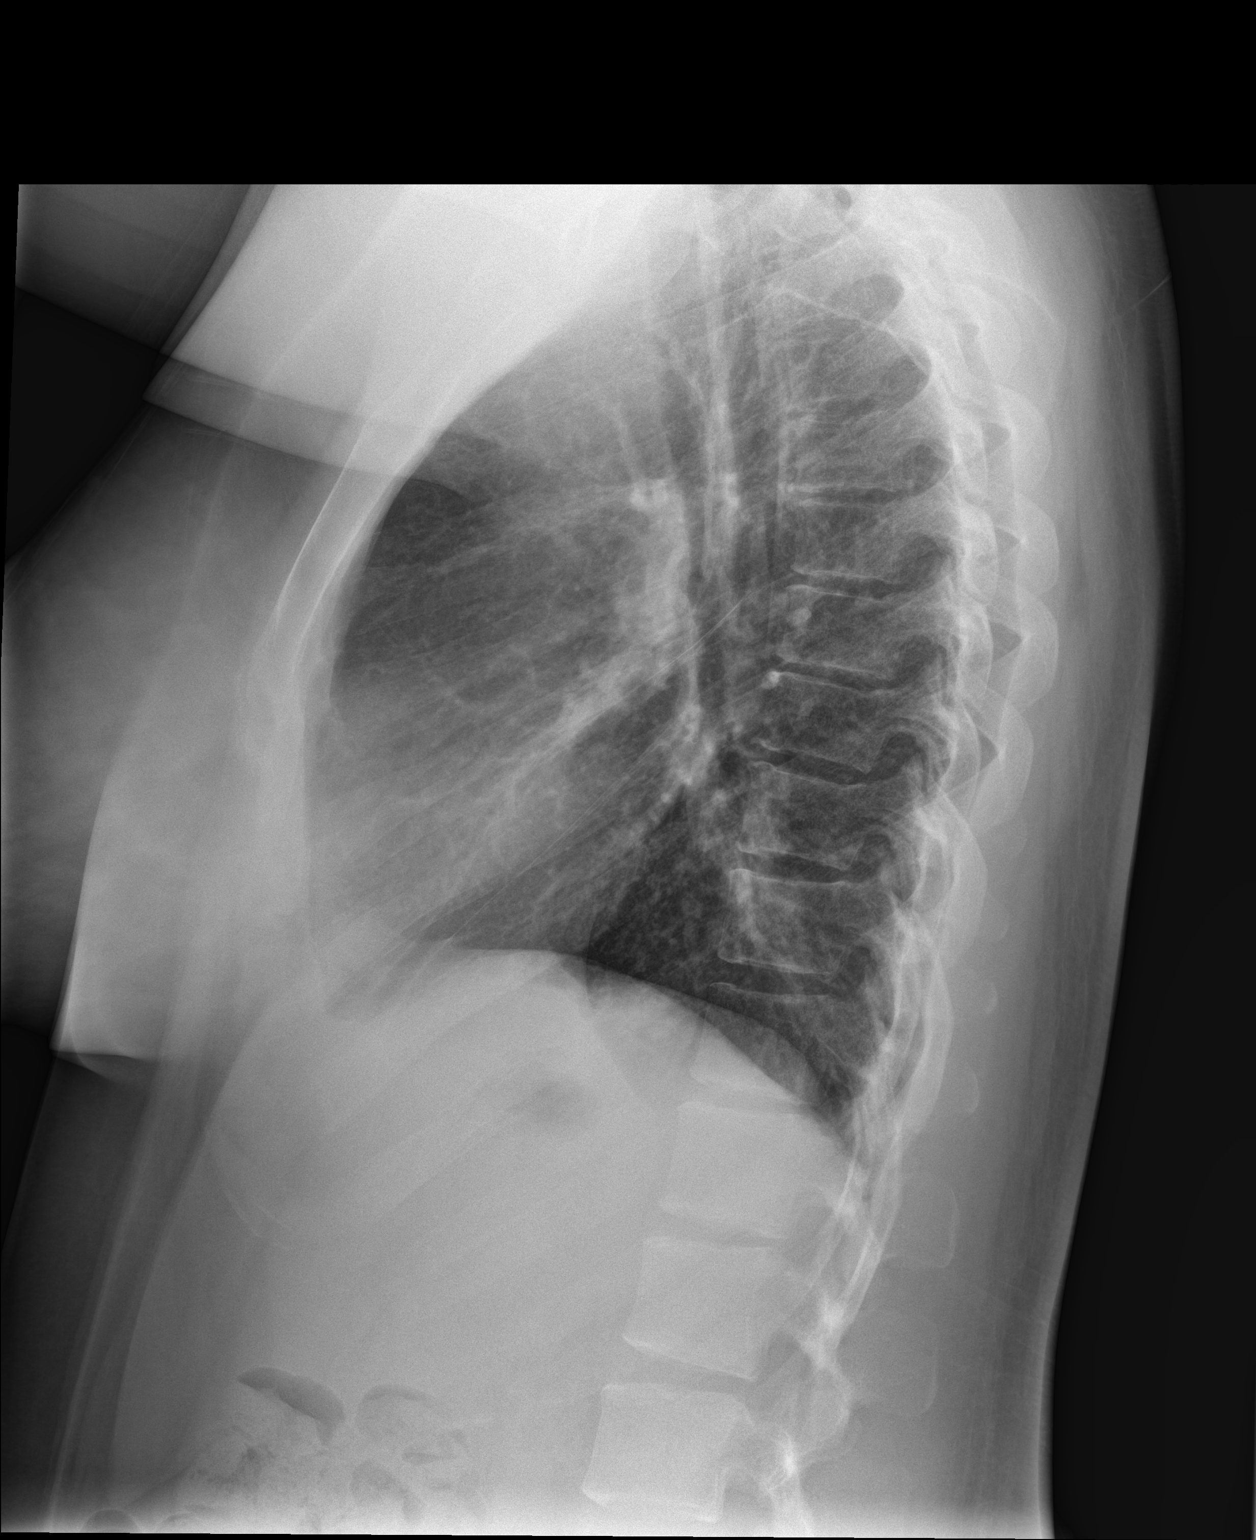

[2 of 2 positions shown; findings below may reference images not displayed]

FINDINGS: Subtle reticulonodular opacity at the left base. No edema, effusion,
or pneumothorax. Normal heart size.
IMPRESSION: Early pneumonia on the left.

## 2023-05-04 LAB — OB RESULTS CONSOLE ANTIBODY SCREEN: Antibody Screen: NEGATIVE

## 2023-06-08 ENCOUNTER — Encounter: Payer: Self-pay | Admitting: Obstetrics and Gynecology

## 2023-06-08 ENCOUNTER — Observation Stay
Admission: EM | Admit: 2023-06-08 | Discharge: 2023-06-08 | Disposition: A | Discharge status: Home / Self Care | Payer: Medicaid Other | Attending: Certified Nurse Midwife | Admitting: Certified Nurse Midwife

## 2023-06-08 ENCOUNTER — Other Ambulatory Visit: Payer: Self-pay

## 2023-06-08 DIAGNOSIS — M25579 Pain in unspecified ankle and joints of unspecified foot: Secondary | ICD-10-CM | POA: Insufficient documentation

## 2023-06-08 DIAGNOSIS — Z3A35 35 weeks gestation of pregnancy: Secondary | ICD-10-CM | POA: Diagnosis not present

## 2023-06-08 DIAGNOSIS — T63421A Toxic effect of venom of ants, accidental (unintentional), initial encounter: Secondary | ICD-10-CM | POA: Diagnosis not present

## 2023-06-08 DIAGNOSIS — O99213 Obesity complicating pregnancy, third trimester: Secondary | ICD-10-CM | POA: Insufficient documentation

## 2023-06-08 DIAGNOSIS — O26893 Other specified pregnancy related conditions, third trimester: Secondary | ICD-10-CM | POA: Diagnosis present

## 2023-06-08 DIAGNOSIS — R102 Pelvic and perineal pain: Secondary | ICD-10-CM | POA: Diagnosis not present

## 2023-06-08 DIAGNOSIS — E669 Obesity, unspecified: Secondary | ICD-10-CM | POA: Diagnosis not present

## 2023-06-08 LAB — FETAL FIBRONECTIN: Fetal Fibronectin: NEGATIVE

## 2023-06-08 MED ORDER — ACETAMINOPHEN 500 MG PO TABS
1000.0000 mg | ORAL_TABLET | Freq: Four times a day (QID) | ORAL | Status: DC | PRN
  Administered 2023-06-08: 1000 mg via ORAL
  Filled 2023-06-08: qty 2

## 2023-06-08 MED ORDER — DOCUSATE SODIUM 100 MG PO CAPS: 100.0000 mg | ORAL_CAPSULE | Freq: Every day | ORAL | Status: DC

## 2023-06-08 MED ORDER — LACTATED RINGERS IV SOLN: INTRAVENOUS | Status: AC

## 2023-06-08 MED ORDER — DIPHENHYDRAMINE HCL 25 MG PO CAPS
50.0000 mg | ORAL_CAPSULE | Freq: Once | ORAL | Status: DC
  Filled 2023-06-08: qty 2

## 2023-06-08 MED ORDER — CALCIUM CARBONATE ANTACID 500 MG PO CHEW: 2.0000 | CHEWABLE_TABLET | ORAL | Status: DC | PRN

## 2023-06-08 MED ORDER — PRENATAL MULTIVITAMIN CH: 1.0000 | ORAL_TABLET | Freq: Every day | ORAL | Status: DC

## 2023-06-08 NOTE — OB Triage Note (Signed)
Pt co abdominal cramping since [phone] last night. "Dull/constant" ache. Denies LOF, vaginal bleeding. Elaina Hoops

## 2023-06-08 NOTE — OB Triage Note (Signed)
Discharged home. Left floor ambulatory. [name]

## 2023-06-08 NOTE — Discharge Summary (Signed)
[name] is a 32 y.o. female. She is at [redacted]w[redacted]d gestation. Patient's last menstrual period was 10/13/2022. Estimated Date of Delivery: 07/20/23  Prenatal care site: [facility]   Current pregnancy complicated by:  - hx of preterm delivery x 2 (primary c-section at 35wks for breech, PPROM at 33wks with TOLAC at 34wks) - previous cesarean section - obesity - nicotine dependence (vaping) - hx of PPROM with G2  - bicornuate uterus - anxiety and depression - pyelectasis of fetus - Rh negative - hyperthyroid  Chief complaint: fire ant bites, ankle swelling, pelvic "tightness"  She reports last night she was bit by several fire ants and her ankles are swollen and painful. Last night she also started feeling pelvic tightness and discomfort and the pain radiated into her inner thighs. She reports feeling regular contractions but was not drinking much fluids today. Endorses good fetal movement. Denies leaking fluid or bleeding.  S: Resting comfortably. Occasional CTX, no VB.no LOF,  Active fetal movement.  Denies: HA, visual changes, SOB, or RUQ/epigastric pain  Maternal Medical History:   Past Medical History:  Diagnosis Date   Anxiety    Depression     Past Surgical History:  Procedure Laterality Date   TYMPANOSTOMY TUBE PLACEMENT      No Known Allergies  Prior to Admission medications   Medication Sig Start Date End Date Taking? Authorizing Provider  busPIRone (BUSPAR) 5 MG tablet Take 5 mg by mouth 3 (three) times daily.   Yes [provider]  citalopram (CELEXA) 20 MG tablet Take by mouth. 07/18/16  Yes [provider]  Prenatal Vit-Fe Fumarate-FA (MULTIVITAMIN-PRENATAL) 27-0.8 MG TABS tablet Take 1 tablet by mouth daily at 12 noon.   Yes [provider]  albuterol (VENTOLIN HFA) 108 (90 Base) MCG/ACT inhaler Inhale 1-2 puffs into the lungs every 6 (six) hours as needed for wheezing or shortness of breath. Patient not taking: Reported  on 06/08/2023 08/06/21   Eusebio Friendly B, PA-C  azithromycin (ZITHROMAX) 250 MG tablet Take 1 tablet (250 mg total) by mouth daily. Take first 2 tablets together, then 1 every day until finished. Patient not taking: Reported on 06/08/2023 08/06/21   Gareth Morgan    Social History: She  reports that she has never smoked. She has never used smokeless tobacco. She reports current alcohol use. She reports that she does not use drugs.  Family History: family history is not on file.  no history of gyn cancers  Review of Systems: A full review of systems was performed and negative except as noted in the HPI.    O:  BP 103/67 (BP Location: Right Arm)   Pulse 89   Temp 98.1 F (36.7 C) (Oral)   Ht 5\' 1"  (1.549 m)   Wt 91.6 kg   LMP 10/13/2022   BMI 38.17 kg/m  Results for orders placed or performed during the hospital encounter of 06/08/23 (from the past 48 hour(s))  Fetal fibronectin   Collection Time: 06/08/23  2:37 PM  Result Value Ref Range   Fetal Fibronectin NEGATIVE NEGATIVE     Constitutional: NAD, AAOx3  HE/ENT: extraocular movements grossly intact, moist mucous membranes CV: RRR PULM: nl respiratory effort, CTABL     Abd: gravid, non-tender, non-distended, soft      Ext: Non-tender, tops of feet edematous around the area of multiple red circles where she reports ant bites   Psych: mood appropriate, speech normal Pelvic: SSE done, cervix visualized closed and thick  Pelvic exam: normal external genitalia, vulva, vagina, cervix, uterus and adnexa.  Fetal  monitoring: Cat 1 Appropriate for GA Baseline: 135bpm Variability: moderate Accelerations: present x >2 Decelerations absent Uterine contractions: initially q2-37min, now rare  A/P: 32 y.o. [redacted]w[redacted]d here for antenatal surveillance for fire ant bites and pelvic pain with hx of preterm labor  Principle Diagnosis:  Fire ant bites, Pelvic pain and tightness  Preterm Labor: not present. Contractions stopped after 1L LR  IV bolus. FFN negative. Cervix visualized as closed and long. Reviewed PTL precautions with her.  Fire ant bites: offered Benadryl 50mg  PO x 1, she declined at this time. Recommended using Benadryl cream on the bites to relieve some of the swelling and discomfort. Fetal Wellbeing: Reassuring Cat 1 tracing. Reactive NST  D/c home stable, precautions reviewed, follow-up as scheduled.    Janyce Llanos, CNM 06/08/2023 4:01 PM

## 2023-06-26 ENCOUNTER — Other Ambulatory Visit: Payer: Self-pay

## 2023-06-26 ENCOUNTER — Inpatient Hospital Stay: Payer: Medicaid Other | Admitting: Certified Registered Nurse Anesthetist

## 2023-06-26 ENCOUNTER — Encounter: Payer: Self-pay | Admitting: Obstetrics and Gynecology

## 2023-06-26 ENCOUNTER — Inpatient Hospital Stay
Admission: EM | Admit: 2023-06-26 | Discharge: 2023-06-28 | DRG: 806 | Disposition: A | Discharge status: Home / Self Care | Payer: Medicaid Other | Source: Ambulatory Visit | Attending: Obstetrics and Gynecology | Admitting: Obstetrics and Gynecology

## 2023-06-26 DIAGNOSIS — Z23 Encounter for immunization: Secondary | ICD-10-CM

## 2023-06-26 DIAGNOSIS — O4292 Full-term premature rupture of membranes, unspecified as to length of time between rupture and onset of labor: Principal | ICD-10-CM | POA: Diagnosis present

## 2023-06-26 DIAGNOSIS — Q513 Bicornate uterus: Secondary | ICD-10-CM

## 2023-06-26 DIAGNOSIS — O4290 Premature rupture of membranes, unspecified as to length of time between rupture and onset of labor, unspecified weeks of gestation: Secondary | ICD-10-CM | POA: Diagnosis present

## 2023-06-26 DIAGNOSIS — Z6791 Unspecified blood type, Rh negative: Secondary | ICD-10-CM

## 2023-06-26 DIAGNOSIS — Z3A36 36 weeks gestation of pregnancy: Secondary | ICD-10-CM | POA: Diagnosis not present

## 2023-06-26 DIAGNOSIS — F17290 Nicotine dependence, other tobacco product, uncomplicated: Secondary | ICD-10-CM | POA: Diagnosis present

## 2023-06-26 DIAGNOSIS — O3403 Maternal care for unspecified congenital malformation of uterus, third trimester: Secondary | ICD-10-CM | POA: Diagnosis present

## 2023-06-26 DIAGNOSIS — O99214 Obesity complicating childbirth: Secondary | ICD-10-CM | POA: Diagnosis present

## 2023-06-26 DIAGNOSIS — O26893 Other specified pregnancy related conditions, third trimester: Secondary | ICD-10-CM | POA: Diagnosis present

## 2023-06-26 DIAGNOSIS — O9081 Anemia of the puerperium: Secondary | ICD-10-CM | POA: Diagnosis not present

## 2023-06-26 DIAGNOSIS — D62 Acute posthemorrhagic anemia: Secondary | ICD-10-CM | POA: Diagnosis not present

## 2023-06-26 DIAGNOSIS — O99334 Smoking (tobacco) complicating childbirth: Secondary | ICD-10-CM | POA: Diagnosis present

## 2023-06-26 DIAGNOSIS — O34219 Maternal care for unspecified type scar from previous cesarean delivery: Secondary | ICD-10-CM | POA: Diagnosis present

## 2023-06-26 LAB — CBC
HCT: 35.7 % — ABNORMAL LOW (ref 36.0–46.0)
Hemoglobin: 11.7 g/dL — ABNORMAL LOW (ref 12.0–15.0)
MCH: 27.9 pg (ref 26.0–34.0)
MCHC: 32.8 g/dL (ref 30.0–36.0)
MCV: 85.2 fL (ref 80.0–100.0)
Platelets: 161 10*3/uL (ref 150–400)
RBC: 4.19 MIL/uL (ref 3.87–5.11)
RDW: 12.9 % (ref 11.5–15.5)
WBC: 10.7 10*3/uL — ABNORMAL HIGH (ref 4.0–10.5)
nRBC: 0 % (ref 0.0–0.2)

## 2023-06-26 LAB — TYPE AND SCREEN
ABO/RH(D): O NEG
Antibody Screen: POSITIVE

## 2023-06-26 LAB — RUPTURE OF MEMBRANE (ROM)PLUS: Rom Plus: POSITIVE

## 2023-06-26 LAB — GROUP B STREP BY PCR: Group B strep by PCR: NEGATIVE

## 2023-06-26 LAB — ABO/RH: ABO/RH(D): O NEG

## 2023-06-26 MED ORDER — ZOLPIDEM TARTRATE 5 MG PO TABS: 5.0000 mg | ORAL_TABLET | Freq: Every evening | ORAL | Status: DC | PRN

## 2023-06-26 MED ORDER — FENTANYL-BUPIVACAINE-NACL 0.5-0.125-0.9 MG/250ML-% EP SOLN
EPIDURAL | Status: AC
  Filled 2023-06-26: qty 250

## 2023-06-26 MED ORDER — ACETAMINOPHEN 325 MG PO TABS
650.0000 mg | ORAL_TABLET | ORAL | Status: DC | PRN
  Filled 2023-06-26: qty 2

## 2023-06-26 MED ORDER — OXYCODONE HCL 5 MG PO TABS: 10.0000 mg | ORAL_TABLET | ORAL | Status: DC | PRN

## 2023-06-26 MED ORDER — ONDANSETRON HCL 4 MG/2ML IJ SOLN: 4.0000 mg | INTRAMUSCULAR | Status: DC | PRN

## 2023-06-26 MED ORDER — PENICILLIN G POT IN DEXTROSE 60000 UNIT/ML IV SOLN: 3.0000 10*6.[IU] | INTRAVENOUS | Status: DC

## 2023-06-26 MED ORDER — BENZOCAINE-MENTHOL 20-0.5 % EX AERO
INHALATION_SPRAY | CUTANEOUS | Status: AC
  Filled 2023-06-26: qty 56

## 2023-06-26 MED ORDER — LIDOCAINE HCL (PF) 1 % IJ SOLN
INTRAMUSCULAR | Status: DC | PRN
  Administered 2023-06-26: 2 mL
  Administered 2023-06-26: 3 mL

## 2023-06-26 MED ORDER — OXYTOCIN 10 UNIT/ML IJ SOLN
INTRAMUSCULAR | Status: AC
  Filled 2023-06-26: qty 2

## 2023-06-26 MED ORDER — SIMETHICONE 80 MG PO CHEW
80.0000 mg | CHEWABLE_TABLET | ORAL | Status: DC | PRN
  Administered 2023-06-27: 80 mg via ORAL
  Filled 2023-06-26: qty 1

## 2023-06-26 MED ORDER — BENZOCAINE-MENTHOL 20-0.5 % EX AERO
1.0000 | INHALATION_SPRAY | CUTANEOUS | Status: DC | PRN
  Administered 2023-06-26: 1 via TOPICAL

## 2023-06-26 MED ORDER — IBUPROFEN 600 MG PO TABS
600.0000 mg | ORAL_TABLET | Freq: Four times a day (QID) | ORAL | Status: DC
  Administered 2023-06-26 – 2023-06-28 (×7): 600 mg via ORAL
  Filled 2023-06-26 (×7): qty 1

## 2023-06-26 MED ORDER — OXYTOCIN-SODIUM CHLORIDE 30-0.9 UT/500ML-% IV SOLN
2.5000 [IU]/h | INTRAVENOUS | Status: DC
  Filled 2023-06-26: qty 500

## 2023-06-26 MED ORDER — ONDANSETRON HCL 4 MG PO TABS: 4.0000 mg | ORAL_TABLET | ORAL | Status: DC | PRN

## 2023-06-26 MED ORDER — FENTANYL-BUPIVACAINE-NACL 0.5-0.125-0.9 MG/250ML-% EP SOLN
EPIDURAL | Status: DC | PRN
  Administered 2023-06-26: 12 mL/h via EPIDURAL

## 2023-06-26 MED ORDER — LACTATED RINGERS IV SOLN: INTRAVENOUS | Status: DC

## 2023-06-26 MED ORDER — PRENATAL MULTIVITAMIN CH
1.0000 | ORAL_TABLET | Freq: Every day | ORAL | Status: DC
  Administered 2023-06-27 – 2023-06-28 (×2): 1 via ORAL
  Filled 2023-06-26 (×2): qty 1

## 2023-06-26 MED ORDER — SODIUM CHLORIDE 0.9 % IV SOLN: 5.0000 10*6.[IU] | Freq: Once | INTRAVENOUS | Status: DC

## 2023-06-26 MED ORDER — MISOPROSTOL 200 MCG PO TABS
ORAL_TABLET | ORAL | Status: AC
  Filled 2023-06-26: qty 4

## 2023-06-26 MED ORDER — LIDOCAINE-EPINEPHRINE (PF) 1.5 %-1:200000 IJ SOLN
INTRAMUSCULAR | Status: DC | PRN
  Administered 2023-06-26: 3 mL via EPIDURAL

## 2023-06-26 MED ORDER — OXYTOCIN BOLUS FROM INFUSION
333.0000 mL | Freq: Once | INTRAVENOUS | Status: AC
  Administered 2023-06-26: 333 mL via INTRAVENOUS

## 2023-06-26 MED ORDER — FERROUS SULFATE 325 (65 FE) MG PO TABS
325.0000 mg | ORAL_TABLET | Freq: Two times a day (BID) | ORAL | Status: DC
  Administered 2023-06-27 – 2023-06-28 (×3): 325 mg via ORAL
  Filled 2023-06-26 (×3): qty 1

## 2023-06-26 MED ORDER — ONDANSETRON HCL 4 MG/2ML IJ SOLN: 4.0000 mg | Freq: Four times a day (QID) | INTRAMUSCULAR | Status: DC | PRN

## 2023-06-26 MED ORDER — WITCH HAZEL-GLYCERIN EX PADS
1.0000 | MEDICATED_PAD | CUTANEOUS | Status: DC | PRN
  Administered 2023-06-26: 1 via TOPICAL

## 2023-06-26 MED ORDER — DIPHENHYDRAMINE HCL 25 MG PO CAPS: 25.0000 mg | ORAL_CAPSULE | Freq: Four times a day (QID) | ORAL | Status: DC | PRN

## 2023-06-26 MED ORDER — TETANUS-DIPHTH-ACELL PERTUSSIS 5-2.5-18.5 LF-MCG/0.5 IM SUSY
0.5000 mL | PREFILLED_SYRINGE | Freq: Once | INTRAMUSCULAR | Status: DC
  Filled 2023-06-26: qty 0.5

## 2023-06-26 MED ORDER — IBUPROFEN 600 MG PO TABS
600.0000 mg | ORAL_TABLET | Freq: Once | ORAL | Status: AC
  Administered 2023-06-26: 600 mg via ORAL
  Filled 2023-06-26: qty 1

## 2023-06-26 MED ORDER — SENNOSIDES-DOCUSATE SODIUM 8.6-50 MG PO TABS
2.0000 | ORAL_TABLET | Freq: Every day | ORAL | Status: DC
  Administered 2023-06-27 – 2023-06-28 (×2): 2 via ORAL
  Filled 2023-06-26 (×2): qty 2

## 2023-06-26 MED ORDER — LIDOCAINE HCL (PF) 1 % IJ SOLN
30.0000 mL | INTRAMUSCULAR | Status: DC | PRN
  Filled 2023-06-26: qty 30

## 2023-06-26 MED ORDER — SOD CITRATE-CITRIC ACID 500-334 MG/5ML PO SOLN: 30.0000 mL | ORAL | Status: DC | PRN

## 2023-06-26 MED ORDER — LACTATED RINGERS IV SOLN
500.0000 mL | INTRAVENOUS | Status: DC | PRN
  Administered 2023-06-26: 500 mL via INTRAVENOUS

## 2023-06-26 MED ORDER — OXYCODONE HCL 5 MG PO TABS: 5.0000 mg | ORAL_TABLET | ORAL | Status: DC | PRN

## 2023-06-26 MED ORDER — COCONUT OIL OIL
1.0000 | TOPICAL_OIL | Status: DC | PRN
  Filled 2023-06-26: qty 7.5

## 2023-06-26 MED ORDER — WITCH HAZEL-GLYCERIN EX PADS
MEDICATED_PAD | CUTANEOUS | Status: AC
  Filled 2023-06-26: qty 100

## 2023-06-26 MED ORDER — OXYCODONE-ACETAMINOPHEN 5-325 MG PO TABS: 2.0000 | ORAL_TABLET | ORAL | Status: DC | PRN

## 2023-06-26 MED ORDER — AMMONIA AROMATIC IN INHA
RESPIRATORY_TRACT | Status: AC
  Filled 2023-06-26: qty 10

## 2023-06-26 MED ORDER — BUPIVACAINE HCL (PF) 0.25 % IJ SOLN
INTRAMUSCULAR | Status: DC | PRN
  Administered 2023-06-26: 3 mL via EPIDURAL
  Administered 2023-06-26: 4 mL via EPIDURAL

## 2023-06-26 MED ORDER — FENTANYL CITRATE (PF) 100 MCG/2ML IJ SOLN: 50.0000 ug | INTRAMUSCULAR | Status: DC | PRN

## 2023-06-26 MED ORDER — OXYCODONE-ACETAMINOPHEN 5-325 MG PO TABS: 1.0000 | ORAL_TABLET | ORAL | Status: DC | PRN

## 2023-06-26 MED ORDER — DIBUCAINE (PERIANAL) 1 % EX OINT: 1.0000 | TOPICAL_OINTMENT | CUTANEOUS | Status: DC | PRN

## 2023-06-26 MED ORDER — ACETAMINOPHEN 325 MG PO TABS
650.0000 mg | ORAL_TABLET | ORAL | Status: DC | PRN
  Administered 2023-06-26 – 2023-06-28 (×7): 650 mg via ORAL
  Filled 2023-06-26 (×6): qty 2

## 2023-06-26 NOTE — Anesthesia Preprocedure Evaluation (Signed)
Anesthesia Evaluation  Patient identified by MRN, date of birth, ID band Patient awake    Reviewed: Allergy & Precautions, H&P , NPO status , Patient's Chart, lab work & pertinent test results  Airway Mallampati: II       Dental no notable dental hx.    Pulmonary Current Smoker and Patient abstained from smoking.   Pulmonary exam normal        Cardiovascular negative cardio ROS Normal cardiovascular exam     Neuro/Psych  PSYCHIATRIC DISORDERS Anxiety Depression    negative neurological ROS     GI/Hepatic negative GI ROS, Neg liver ROS,,,  Endo/Other  negative endocrine ROS    Renal/GU negative Renal ROS  negative genitourinary   Musculoskeletal   Abdominal   Peds  Hematology negative hematology ROS (+)   Anesthesia Other Findings   Reproductive/Obstetrics (+) Pregnancy                             Anesthesia Physical Anesthesia Plan  ASA: 2  Anesthesia Plan:    Post-op Pain Management:    Induction:   PONV Risk Score and Plan:   Airway Management Planned:   Additional Equipment:   Intra-op Plan:   Post-operative Plan:   Informed Consent:   Plan Discussed with: Anesthesiologist and CRNA  Anesthesia Plan Comments:        Anesthesia Quick Evaluation

## 2023-06-26 NOTE — Discharge Summary (Signed)
Postpartum Discharge Summary  Patient Name: [name] DOB: 1991-08-04 MRN: [id number]  Date of admission: 06/26/2023 Delivery date:06/26/2023 Delivering provider: Haroldine Laws Date of discharge: 06/28/2023  Primary OB: [facility] GEX:[id number] last menstrual period was 10/13/2022. EDC Estimated Date of Delivery: 07/20/23 Gestational Age at Delivery: [redacted]w[redacted]d   Admitting diagnosis: Amniotic fluid leaking [O42.90] Intrauterine pregnancy: [redacted]w[redacted]d     Secondary diagnosis:   Principal Problem:   VBAC (vaginal birth after Cesarean) Active Problems:   Amniotic fluid leaking   Discharge Diagnosis: [facility]      Hospital course: Onset of Labor With Vaginal Delivery      32 y.o. yo [id number] at [redacted]w[redacted]d was admitted in Latent Labor on 06/26/2023. Labor course was complicated by none  Membrane Rupture Time/Date: 6:00 AM,06/26/2023  Delivery Method:VBAC, Spontaneous Operative Delivery:N/A Episiotomy: None Lacerations:  None Patient had an uncomplicated postpartum course. She is ambulating, tolerating a regular diet, passing flatus, and urinating well. Patient is discharged home in stable condition on 06/28/23.  Newborn Data: Birth date:06/26/2023 Birth time:6:56 PM Gender:Female Living status:Living Apgars:9 ,9  Weight:2970 g                                            Post partum procedures: None Augmentation:: N/A Complications: None Delivery Type: spontaneous vaginal delivery Anesthesia: epidural anesthesia Placenta: spontaneous To Pathology: No   Prenatal Labs:  Blood type/Rh O neg  Antibody screen neg  Rubella Immune  Varicella Immune  RPR NR  HBsAg Neg  HIV NR  GC neg  Chlamydia neg  Genetic screening negative  1 hour GTT 114  3 hour GTT    GBS pending    Magnesium Sulfate received: No BMZ received: No Rhophylac:was not indicated MMR: was not indicated Varivax vaccine given: was not indicated T-DaP: declined prenatally  Flu: declined prenatally    Transfusion:No  Physical exam  Vitals:   06/27/23 0811 06/27/23 1642 06/27/23 2306 06/28/23 0845  BP: 105/66 (!) 104/53 101/67 122/81  Pulse: 95 93 99 91  Resp: 18 18 18 18   Temp: 98.3 F (36.8 C) 98.1 F (36.7 C) 98 F (36.7 C) 98.4 F (36.9 C)  TempSrc: Oral Oral Oral Oral  SpO2: 100% 100% 97% 100%  Weight:      Height:       General: alert, cooperative, and no distress Lochia: appropriate Uterine Fundus: firm Perineum:minimal edema/intact DVT Evaluation: No evidence of DVT seen on physical exam. Negative Homan's sign. No cords or calf tenderness. No significant calf/ankle edema.  Labs: Lab Results  Component Value Date   WBC 12.0 (H) 06/27/2023   HGB 11.2 (L) 06/27/2023   HCT 33.0 (L) 06/27/2023   MCV 84.0 06/27/2023   PLT 149 (L) 06/27/2023      Latest Ref Rng & Units 12/08/2011   11:44 AM  CMP  Glucose 65 - 99 mg/dL 81   BUN 7 - 18 mg/dL 10   Creatinine 5.36 - 1.30 mg/dL 6.44   Sodium 034 - 742 mmol/L 141   Potassium 3.5 - 5.1 mmol/L 4.3   Chloride 98 - 107 mmol/L 106   CO2 21 - 32 mmol/L 27   Calcium 8.5 - 10.1 mg/dL 8.8    Edinburgh Score:    06/28/2023   10:00 AM  Edinburgh Postnatal Depression Scale Screening Tool  I have been able to laugh and see the funny side  of things. 0  I have looked forward with enjoyment to things. 0  I have blamed myself unnecessarily when things went wrong. 0  I have been anxious or worried for no good reason. 3  I have felt scared or panicky for no good reason. 2  Things have been getting on top of me. 2  I have been so unhappy that I have had difficulty sleeping. 1  I have felt sad or miserable. 1  I have been so unhappy that I have been crying. 1  The thought of harming myself has occurred to me. 0  Edinburgh Postnatal Depression Scale Total 10    Risk assessment for postpartum VTE and prophylactic treatment: Very high risk factors: None High risk factors: None Moderate risk factors: BMI 30-40  kg/m2  Postpartum VTE prophylaxis with LMWH not indicated  After visit meds:  Allergies as of 06/28/2023   No Known Allergies      Medication List     STOP taking these medications    albuterol 108 (90 Base) MCG/ACT inhaler Commonly known as: VENTOLIN HFA   azithromycin 250 MG tablet Commonly known as: ZITHROMAX   busPIRone 5 MG tablet Commonly known as: BUSPAR   citalopram 20 MG tablet Commonly known as: CELEXA       TAKE these medications    acetaminophen 325 MG tablet Commonly known as: Tylenol Take 2 tablets (650 mg total) by mouth every 4 (four) hours as needed (for pain scale < 4).   benzocaine-Menthol 20-0.5 % Aero Commonly known as: DERMOPLAST Apply 1 Application topically as needed for irritation (perineal discomfort).   coconut oil Oil Apply 1 Application topically as needed.   dibucaine 1 % Oint Commonly known as: NUPERCAINAL Place 1 Application rectally as needed for hemorrhoids.   ferrous sulfate 325 (65 FE) MG tablet Take 1 tablet (325 mg total) by mouth 2 (two) times daily with a meal.   ibuprofen 600 MG tablet Commonly known as: ADVIL Take 1 tablet (600 mg total) by mouth every 6 (six) hours.   Lansinoh Lanolin Nipple Crea Apply 1.41 oz topically as needed (for sore nipples).   multivitamin-prenatal 27-0.8 MG Tabs tablet Take 1 tablet by mouth daily at 12 noon.   prenatal multivitamin Tabs tablet Take 1 tablet by mouth daily at 12 noon.   senna-docusate 8.6-50 MG tablet Commonly known as: Senokot-S Take 2 tablets by mouth daily. Start taking on: June 29, 2023   witch hazel-glycerin pad Commonly known as: TUCKS Apply 1 Application topically as needed for hemorrhoids.       Discharge home in stable condition Infant Feeding: Breast Infant Disposition:home with mother Discharge instruction: per After Visit Summary and Postpartum booklet. Activity: Advance as tolerated. Pelvic rest for 6 weeks.  Diet: routine  diet Anticipated Birth Control: Nexplanon Postpartum Appointment:6 weeks Additional Postpartum F/U: Postpartum Depression checkup Future Appointments: 1 week mood check  Follow up Visit:  Follow-up Information     Haroldine Laws, CNM Follow up in 1 week(s).   Specialty: Certified Nurse Midwife Why: for a mood check Contact information: [address] [place] [address] [phone]                 Plan:  [name] was discharged to home in good condition. Follow-up appointment as directed.    Signed: Roney Jaffe, CNM

## 2023-06-26 NOTE — H&P (Addendum)
OB History & Physical   History of Present Illness:  Chief Complaint:   HPI:  [name] is a 32 y.o. [id number] female at [redacted]w[redacted]d dated by LMP.  She presents to L&D for PROM.  She reports:  -active fetal movement -LOF/SROM at 0600 -no vaginal bleeding -no contractions  Pregnancy Issues: Obesity BMI: Body mass index is 36.18 kg/m. 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 Authorizing Provider  albuterol (VENTOLIN HFA) 108 (90 Base) MCG/ACT inhaler Inhale 1-2 puffs into the lungs every 6 (six) hours as needed for wheezing or shortness of breath. Patient not taking: Reported on 06/08/2023 08/06/21   Eusebio Friendly B, PA-C  azithromycin (ZITHROMAX) 250 MG tablet Take 1 tablet (250 mg total) by mouth daily. Take first 2 tablets together, then 1 every day until finished. Patient not taking: Reported on 06/08/2023 08/06/21   Eusebio Friendly B, PA-C  busPIRone (BUSPAR) 5 MG tablet Take 5 mg by mouth 3 (three) times daily.    [provider]  citalopram (CELEXA) 20 MG tablet Take by mouth. 07/18/16   [provider]  Prenatal Vit-Fe Fumarate-FA (MULTIVITAMIN-PRENATAL) 27-0.8 MG TABS tablet Take 1 tablet by mouth daily at 12 noon.    [provider]     Prenatal care site: [facility]  Social History: She  reports that she has been smoking e-cigarettes. She uses smokeless tobacco. She reports current alcohol use. She reports that she does not use drugs.  Family  History: family history is not on file.   Review of Systems: A full review of systems was performed and negative except as noted in the HPI.    Physical Exam:  Vital Signs: BP 115/64   Pulse 86   Temp 97.9 F (36.6 C) (Oral)   Resp 17   Ht 5\' 1"  (1.549 m)   Wt 95.3 kg   LMP 10/13/2022   BMI 39.68 kg/m   General:   alert and cooperative  Skin:  normal  Neurologic:    Alert & oriented x 3  Lungs:    Nl effort  Heart:   regular rate and rhythm  Abdomen:  soft, non-tender; bowel sounds normal; no masses,  no organomegaly  Extremities: : non-tender, symmetric, no edema bilaterally.      EFW: 06/19/23  2,949  g       61%     6 lb 8  oz   Results for orders placed or performed during the hospital encounter of 06/26/23 (from the past 24 hour(s))  Rupture of Membrane (ROM) Plus     Status: None   Collection Time: 06/26/23  9:28 AM  Result Value Ref Range   Rom Plus POSITIVE     Pertinent Results:  Prenatal Labs: Blood type/Rh O neg  Antibody screen neg  Rubella Immune  Varicella Immune  RPR NR  HBsAg Neg  HIV NR  GC neg  Chlamydia neg  Genetic screening negative  1 hour GTT 114  3 hour GTT  GBS pending   FHT: FHR: 130 bpm, variability: moderate,  accelerations:  Present,  decelerations:  Absent Category/reactivity:  Category I TOCO: irregular SVE: Dilation: 3 / Effacement (%): 50 / Station: -2     Assessment:  [name] is a 32 y.o. [phone] female at [redacted]w[redacted]d with PROM.   Plan:  1. Admit to Labor & Delivery; consents reviewed and obtained Discussed with pt the TOLAC risks, pt verbalized understanding and would like to proceed  2. Fetal Well being  - Fetal Tracing: Cat I - GBS pending - Presentation: vtx confirmed by sve   3. Routine OB: - Prenatal labs reviewed, as above - Rh neg - CBC & T&S on admit - Clear fluids, IVF  4. Monitoring of Labor -  Contractions by external toco in place -  Pelvis proven to 2460g -  Plan for continuous fetal  monitoring  -  Maternal pain control as desired: IVPM, nitrous, regional anesthesia - Anticipate vaginal delivery  5. Post Partum Planning: - Infant feeding: Breastfeeding - Contraception: maybe Nexplanon - Tdap: declined - Flu: declined - RSV: declined  Mikah Rottinghaus, CNM 06/26/2023 11:11 AM

## 2023-06-26 NOTE — OB Triage Note (Signed)
[name] reported to L&D for evaluation with complaints of a large gush of clear fluid at 0600 this morning. She reports having some contractions about 10 minutes apart 3/10 on the pain scale.  [name] reports this is her third pregnancy. The first was a cesarean section due to breech presentation and her second she successfully VBAC'd. [name] denies any vaginal bleeding or decreased fetal movements currently.

## 2023-06-26 NOTE — Anesthesia Procedure Notes (Signed)
Epidural Patient location during procedure: OB Start time: 06/26/2023 5:14 PM End time: 06/26/2023 5:28 PM  Staffing Anesthesiologist: Yevette Edwards, MD Resident/CRNA: Hezzie Bump, CRNA Performed: anesthesiologist   Preanesthetic Checklist Completed: patient identified, IV checked, site marked, risks and benefits discussed, surgical consent, monitors and equipment checked and pre-op evaluation  Epidural Patient position: sitting Prep: ChloraPrep Patient monitoring: heart rate, continuous pulse ox and blood pressure Approach: midline Location: L3-L4 Injection technique: LOR saline  Needle:  Needle type: Tuohy  Needle gauge: 17 G Needle length: 9 cm and 9 Catheter type: closed end flexible Catheter size: 19 Gauge Test dose: negative and 1.5% lidocaine with Epi 1:200 K  Assessment Events: blood not aspirated, no cerebrospinal fluid, injection not painful, no injection resistance, no paresthesia and negative IV test  Additional Notes 1 attempt Pt. Evaluated and documentation done after procedure finished. Patient identified. Risks/Benefits/Options discussed with patient including but not limited to bleeding, infection, nerve damage, paralysis, failed block, incomplete pain control, headache, blood pressure changes, nausea, vomiting, reactions to medication both or allergic, itching and postpartum back pain. Confirmed with bedside nurse the patient's most recent platelet count. Confirmed with patient that they are not currently taking any anticoagulation, have any bleeding history or any family history of bleeding disorders. Patient expressed understanding and wished to proceed. All questions were answered. Sterile technique was used throughout the entire procedure. Please see nursing notes for vital signs. Test dose was given through epidural catheter and negative prior to continuing to dose epidural or start infusion. Warning signs of high block given to the patient including  shortness of breath, tingling/numbness in hands, complete motor block, or any concerning symptoms with instructions to call for help. Patient was given instructions on fall risk and not to get out of bed. All questions and concerns addressed with instructions to call with any issues or inadequate analgesia.    Patient tolerated the insertion well without immediate complications.Reason for block:procedure for pain

## 2023-06-26 NOTE — Progress Notes (Signed)
Anesthesia called, notified of patients admission to L&D and TOLAC status.

## 2023-06-26 NOTE — Progress Notes (Signed)
Labor Progress Note  [name] is a 32 y.o. [phone] at [redacted]w[redacted]d by LMP admitted for rupture of membranes  Subjective: RN report pt status  Objective: BP (!) 115/55 (BP Location: Right Arm)   Pulse 100   Temp 98.6 F (37 C) (Oral)   Resp 16   Ht 5\' 1"  (1.549 m)   Wt 95.3 kg   LMP 10/13/2022   SpO2 100%   Breastfeeding Unknown   BMI 39.68 kg/m   Fetal Assessment: FHT:  FHR: 130 bpm, variability: moderate,  accelerations:  Present,  decelerations:  Absent Category/reactivity:  Category I UC:   regular, every 1-6 minutes SVE:    Dilation: 4 cm  Effacement: 70%  Station:  -2  Consistency: soft  Position: anterior  Membrane status: SROM at 0600 Amniotic color: Clear  Labs: Lab Results  Component Value Date   WBC 10.7 (H) 06/26/2023   HGB 11.7 (L) 06/26/2023   HCT 35.7 (L) 06/26/2023   MCV 85.2 06/26/2023   PLT 161 06/26/2023    Assessment / Plan: Spontaneous labor, progressing normally  Labor: Progressing normally Preeclampsia:   126/48 Fetal Wellbeing:  Category I Pain Control:  Labor support without medications I/D:   Afebrile, GBS neg, SROM x 8 hr Anticipated MOD:  NSVD  Cyril Mourning, CNM 06/26/2023, 7:47 PM

## 2023-06-26 NOTE — Progress Notes (Signed)
Labor Progress Note  [name] is a 32 y.o. [phone] at [redacted]w[redacted]d by LMP admitted for rupture of membranes  Subjective: Pt reports UCs are becoming more intense but still irregular  Objective: BP 114/60   Pulse (!) 102   Temp 98.4 F (36.9 C) (Oral)   Resp 18   Ht 5\' 1"  (1.549 m)   Wt 95.3 kg   LMP 10/13/2022   BMI 39.68 kg/m   Fetal Assessment: FHT:  FHR: 130 bpm, variability: moderate,  accelerations:  Present,  decelerations:  Absent Category/reactivity:  Category I UC:   regular, every 5-7 minutes SVE:    Dilation: 4 cm  Effacement: 70%  Station:  -2  Consistency: soft  Position: anterior  Membrane status: SROM at 0600 Amniotic color: Clear  Labs: Lab Results  Component Value Date   WBC 10.7 (H) 06/26/2023   HGB 11.7 (L) 06/26/2023   HCT 35.7 (L) 06/26/2023   MCV 85.2 06/26/2023   PLT 161 06/26/2023    Assessment / Plan: Spontaneous labor, progressing normally  Labor: Progressing normally Preeclampsia:   114/60 Fetal Wellbeing:  Category I Pain Control:  Labor support without medications, IV pain meds, and Nitrous Oxide I/D:   Afebrile, GBS neg, SROM x 7 hr Anticipated MOD:  NSVD  [name], CNM 06/26/2023, 1:55 PM

## 2023-06-26 NOTE — Progress Notes (Signed)
Patient ID: [name], female   DOB: [birth year], 32 y.o.   MRN: [id number] Pt has been counseled regarding TOLAC risks  in [facility] office. I have asked CNM Oxley to re counsel the patient today while in labor

## 2023-06-27 LAB — CBC
HCT: 33 % — ABNORMAL LOW (ref 36.0–46.0)
Hemoglobin: 11.2 g/dL — ABNORMAL LOW (ref 12.0–15.0)
MCH: 28.5 pg (ref 26.0–34.0)
MCHC: 33.9 g/dL (ref 30.0–36.0)
MCV: 84 fL (ref 80.0–100.0)
Platelets: 149 10*3/uL — ABNORMAL LOW (ref 150–400)
RBC: 3.93 MIL/uL (ref 3.87–5.11)
RDW: 13 % (ref 11.5–15.5)
WBC: 12 10*3/uL — ABNORMAL HIGH (ref 4.0–10.5)
nRBC: 0 % (ref 0.0–0.2)

## 2023-06-27 LAB — FETAL SCREEN: Fetal Screen: NEGATIVE

## 2023-06-27 LAB — RPR: RPR Ser Ql: NONREACTIVE

## 2023-06-27 MED ORDER — RHO D IMMUNE GLOBULIN 1500 UNIT/2ML IJ SOSY
300.0000 ug | PREFILLED_SYRINGE | Freq: Once | INTRAMUSCULAR | Status: AC
  Administered 2023-06-27: 300 ug via INTRAVENOUS
  Filled 2023-06-27: qty 2

## 2023-06-27 NOTE — Anesthesia Postprocedure Evaluation (Signed)
Anesthesia Post Note  Patient: [name]  Procedure(s) Performed: AN AD HOC LABOR EPIDURAL  Patient location during evaluation: Mother Baby Anesthesia Type: Epidural Level of consciousness: awake and alert Pain management: pain level controlled Vital Signs Assessment: post-procedure vital signs reviewed and stable Respiratory status: spontaneous breathing, nonlabored ventilation and respiratory function stable Cardiovascular status: stable Postop Assessment: no headache, no backache and epidural receding Anesthetic complications: no  No notable events documented.   Last Vitals:  Vitals:   06/27/23 0324 06/27/23 0811  BP: (!) 113/51 105/66  Pulse: 92 95  Resp: 18 18  Temp: 36.6 C 36.8 C  SpO2: 100% 100%    Last Pain:  Vitals:   06/27/23 0811  TempSrc: Oral  PainSc:                  Elmarie Mainland

## 2023-06-27 NOTE — Lactation Note (Signed)
This note was copied from a baby's chart. Lactation Consultation Note  Patient Name: [name] [id number] Date: 06/27/2023 Age:33 hours     Maternal Data Lactation to room for an initial consult w/ a P3 patient and a 13hr old late preterm baby Samantha. This is an experienced breastfeeding patient.  Patient stated that she breastfed her other children for 22yrs and is still nursing her 33yr old. 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  Patient stated that she can hear infants gulping at the breast, but she is also having a hard time nursing infant on the rt breast.  Patient stated that her nipple is different and maybe flat.  Feeding Patient was ending a breastfeeding session upon entry into the room.  She wanted LC to observe a feeding on the rt breast that her last 2 babies have had trouble latching too.   LC assessed patients nipple and stated that it is not flat at all. She attempted to put infant to the rt breast in football hold.  Infant opens mouth wide and only suckles for a short period of time. LC noticed that patient is placing her sandwich close to the nipple and taught patient how to sandwich her breast outside of the areola. Patient verbalized understanding.    Interventions LC provided education on the following;  milk production expectations, hunger cues, day 1/2 wet/dirty diapers, hand expression, benefits of STS and arousing infant for a feeding.  Lactation informed patient of feeding infant at least 8x w/in a 24hr period but not exceeding 3hrs. Patient verbalized understanding.   LC reviewed the LPT protocol with patient and the importance of pumping/supplementing.  During the discussion of LPT infants LC shared the information about the high readmittance rate of LPT infants and that the protocol is in place to protect infant.  Patient verbalized understanding.  Patient stated to [facility] that  she did not want to start pumping early nor did she want to supplement at the time.  She asked LC if weighted feeds could be done throughout the day to monitor feedings.  LC informed patient that she would speak with pediatrician and then they can go from there.    **After speaking with pediatrician, lactation will monitor infants feedings very closely.  Lactation has not been able to return to this room since the initial discussion in the am.**  Consult Status Follow-up Inpatient    Yvette Rack Free 06/27/2023, 4:44 PM

## 2023-06-27 NOTE — Progress Notes (Signed)
Postpartum Day  1  Subjective: 32 y.o. [id number] postpartum day #1 status post normal spontaneous vaginal delivery. She is ambulating, is tolerating po, is voiding spontaneously.  Her pain is well controlled on PO pain medications. Her lochia is less than menses.  Objective: BP 105/66 (BP Location: Left Arm)   Pulse 95   Temp 98.3 F (36.8 C) (Oral)   Resp 18   Ht 5\' 1"  (1.549 m)   Wt 95.3 kg   LMP 10/13/2022   SpO2 100%   Breastfeeding Unknown   BMI 39.68 kg/m    Physical Exam:  General: alert, cooperative, and appears stated age Breasts: soft/nontender Pulm: nl effort Abdomen: soft, non-tender, active bowel sounds Uterine Fundus: firm Perineum: minimal edema, intact Lochia: appropriate DVT Evaluation: No evidence of DVT seen on physical exam. Negative Homan's sign. No cords or calf tenderness. No significant calf/ankle edema.  Recent Labs    06/26/23 1125 06/27/23 0636  HGB 11.7* 11.2*  HCT 35.7* 33.0*  WBC 10.7* 12.0*  PLT 161 149*    Assessment/Plan: 32 y.o. [id number] postpartum day # 1  1. Continue routine postpartum care  2. Infant feeding status: breast feeding --Lactation consult PRN for breastfeeding   3. Contraception plan:  Nexplanon  4. Acute blood loss anemia - clinically significant.  --Hemodynamically stable and asymptomatic --Intervention: start on oral supplementation with ferrous sulfate 325 mg   5. Immunization status:    declined vaccinations    Disposition: continue inpatient postpartum care    LOS: 1 day   Sherilynn Dieu, CNM 06/27/2023, 4:08 PM   ----- Chari Manning Certified Nurse Midwife [facility]

## 2023-06-28 LAB — RHOGAM INJECTION: Unit division: 0

## 2023-06-28 MED ORDER — BENZOCAINE-MENTHOL 20-0.5 % EX AERO: 1.0000 | INHALATION_SPRAY | CUTANEOUS | Status: AC | PRN

## 2023-06-28 MED ORDER — LANSINOH LANOLIN NIPPLE EX CREA: 1.4100 [oz_av] | TOPICAL_CREAM | CUTANEOUS | Status: AC | PRN

## 2023-06-28 MED ORDER — PRENATAL MULTIVITAMIN CH: 1.0000 | ORAL_TABLET | Freq: Every day | ORAL | Status: AC

## 2023-06-28 MED ORDER — DIBUCAINE (PERIANAL) 1 % EX OINT: 1.0000 | TOPICAL_OINTMENT | CUTANEOUS | Status: AC | PRN

## 2023-06-28 MED ORDER — SENNOSIDES-DOCUSATE SODIUM 8.6-50 MG PO TABS: 2.0000 | ORAL_TABLET | Freq: Every day | ORAL | Status: AC

## 2023-06-28 MED ORDER — WITCH HAZEL-GLYCERIN EX PADS: 1.0000 | MEDICATED_PAD | CUTANEOUS | Status: AC | PRN

## 2023-06-28 MED ORDER — FERROUS SULFATE 325 (65 FE) MG PO TABS: 325.0000 mg | ORAL_TABLET | Freq: Two times a day (BID) | ORAL | Status: AC

## 2023-06-28 MED ORDER — COCONUT OIL OIL: 1.0000 | TOPICAL_OIL | Status: AC | PRN

## 2023-06-28 MED ORDER — ACETAMINOPHEN 325 MG PO TABS: 650.0000 mg | ORAL_TABLET | ORAL | Status: AC | PRN

## 2023-06-28 MED ORDER — IBUPROFEN 600 MG PO TABS: 600.0000 mg | ORAL_TABLET | Freq: Four times a day (QID) | ORAL | 0 refills | Status: AC

## 2023-06-28 NOTE — Discharge Instructions (Signed)

## 2023-06-28 NOTE — Clinical Social Work Maternal (Signed)
  CLINICAL SOCIAL WORK MATERNAL/CHILD NOTE  Patient Details  Name: [name] MRN: [id number] Date of Birth: 1991-05-08  Date:  06/28/2023  Clinical Social Worker Initiating Note:  Darolyn Rua Date/Time: Initiated:   /      Child's Name:  [name]   Biological Parents:  Mother   Need for Interpreter:      Reason for Referral:   (high depression score)   Address:  [address] [place] [address]    Phone number:  [phone] (home)     Additional phone number:   Household Members/Support Persons (HM/SP):       HM/SP Name Relationship DOB or Age  HM/SP -1        HM/SP -2        HM/SP -3        HM/SP -4        HM/SP -5        HM/SP -6        HM/SP -7        HM/SP -8          Natural Supports (not living in the home):      Professional Supports:     Employment:     Type of Work:     Education:      Homebound arranged:    Architect:      Other Resources:      Cultural/Religious Considerations Which May Impact Care:    Strengths:      Psychotropic Medications:         Pediatrician:       Pediatrician List:   [facility]      Pediatrician Fax Number:    Risk Factors/Current Problems:      Cognitive State:      Mood/Affect:      CSW Assessment:   Patient breast feeding upon CSW arrival, brief assessment completed before discharge due to consult for depression score.   Patient reports hx of having insurance problems and not getting meds filled but she currently has insurance and has lexapro at her pharmacy waiting on her she will pick up at dc and start taking.   Has car seat at bedside, named baby [name]. 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  Confirmed address and phone in chart accurate.   No additional needs.    CSW Plan/Description:        Darolyn Rua, LCSW 06/28/2023, 11:28 AM

## 2023-06-28 NOTE — Lactation Note (Signed)
This note was copied from a baby's chart. Lactation Consultation Note  Patient Name: [name] [id number] Date: 06/28/2023 Age:33 hours Reason for consult: Follow-up assessment;Late-preterm 34-36.6wks;Nipple pain/trauma;RN request;Breastfeeding assistance   Maternal Data This is mom's 3rd baby, spontaneous VBAC. Mom with history of anxiety, depression, obesity, hyperthyroidism, and vaping. Mom is an experienced breastfeeding mother and her previous babies were also born less than 37 weeks. Mom reports she is also breastfeeding her 33 year old and plans to continue breastfeeding her two year old in addition to the newborn.  On follow-up visit today mom reports difficult latch at her right breast. Per mom her previous baby only would feed from her left breast and had similar latch issues at the right breast. Mom suspects the pain she is experiencing at the right breast is due to the baby's bottom lip rolled inward . Mom would like assistance with breastfeeding baby at her right breast. Per mom baby has been latching and breastfeeding well at her left breast, with a good number of wet and stool diapers.  Has patient been taught Hand Expression?: Yes Does the patient have breastfeeding experience prior to this delivery?: Yes How long did the patient breastfeed?: Mother currently breastfeeding her 34 year old.  Feeding Mother's Current Feeding Choice: Breast Milk Provided tips and strategies to maximize position and latch technique.  Mom noted to have a vertical compression stripe on her right nipple that has cracked and is now scabbed. Mom is not applying any moist wound healing. Left nipple intact. Recommended and assisted mom with repositioning baby in football hold at the right breast. Baby's bottom lip was rolled inward. Mom was able to independently correct lower lip latch once instructed. Per mom "this feels much better." Baby with multiple audible swallows.  Provided and instructed mom on  options for moist wound healing of right nipple.   LATCH Score Latch: Grasps breast easily, tongue down, lips flanged, rhythmical sucking. (After latch coreection to obtain deeper latch with lower lip correction. Mom was able to correct lower lip latch.)  Audible Swallowing: Spontaneous and intermittent  Type of Nipple: Everted at rest and after stimulation  Comfort (Breast/Nipple): Filling, red/small blisters or bruises, mild/mod discomfort  Hold (Positioning): Assistance needed to correctly position infant at breast and maintain latch.  LATCH Score: 8   Interventions Interventions: Breast feeding basics reviewed;Assisted with latch;Breast compression;Adjust position;Support pillows;Position options;Coconut oil;Comfort gels;Education (Reviewed characteristics of LPT and breastfeeding, Educated mom to observe baby for jaundice below the nipple line especially the 3rd-6th day of life and insure baby is consistently breastfeeding and/or provide expressed milk if necessary.)  Discharge Discharge Education: Engorgement and breast care;Warning signs for feeding baby;Outpatient recommendation Pump: Personal  Consult Status Consult Status: Complete Date: 06/28/23 Follow-up type: In-patient  Updated care nurse and Pediatrician.  Fuller Song 06/28/2023, 11:48 AM

## 2023-06-28 NOTE — Progress Notes (Signed)
Patient discharged home with family.  Discharge instructions, when to follow up, and prescriptions reviewed with patient.  Patient verbalized understanding. Patient will be escorted out by auxiliary.
